# Patient Record
Sex: Female | Born: 1985
Health system: Southern US, Community
[De-identification: ages and names within clinical notes are randomized; demographics above are authoritative.]

## PROBLEM LIST (undated history)

## (undated) ENCOUNTER — Inpatient Hospital Stay (HOSPITAL_COMMUNITY): Payer: Self-pay

## (undated) DIAGNOSIS — R87619 Unspecified abnormal cytological findings in specimens from cervix uteri: Secondary | ICD-10-CM

## (undated) DIAGNOSIS — E669 Obesity, unspecified: Secondary | ICD-10-CM

## (undated) DIAGNOSIS — M329 Systemic lupus erythematosus, unspecified: Secondary | ICD-10-CM

## (undated) DIAGNOSIS — F53 Postpartum depression: Secondary | ICD-10-CM

## (undated) DIAGNOSIS — Z86718 Personal history of other venous thrombosis and embolism: Secondary | ICD-10-CM

## (undated) DIAGNOSIS — IMO0002 Reserved for concepts with insufficient information to code with codable children: Secondary | ICD-10-CM

## (undated) DIAGNOSIS — O99345 Other mental disorders complicating the puerperium: Secondary | ICD-10-CM

## (undated) HISTORY — DX: Personal history of other venous thrombosis and embolism: Z86.718

## (undated) HISTORY — DX: Other mental disorders complicating the puerperium: O99.345

## (undated) HISTORY — PX: WISDOM TOOTH EXTRACTION: SHX21

## (undated) HISTORY — DX: Obesity, unspecified: E66.9

## (undated) HISTORY — DX: Postpartum depression: F53.0

## (undated) HISTORY — DX: Reserved for concepts with insufficient information to code with codable children: IMO0002

## (undated) HISTORY — DX: Systemic lupus erythematosus, unspecified: M32.9

---

## 2010-08-25 ENCOUNTER — Inpatient Hospital Stay (HOSPITAL_COMMUNITY): Admission: AD | Admit: 2010-08-25 | Discharge: 2010-08-25 | Payer: Self-pay | Admitting: Obstetrics and Gynecology

## 2010-09-12 ENCOUNTER — Inpatient Hospital Stay (HOSPITAL_COMMUNITY)
Admission: AD | Admit: 2010-09-12 | Discharge: 2010-09-15 | Payer: Self-pay | Source: Home / Self Care | Attending: Obstetrics and Gynecology | Admitting: Obstetrics and Gynecology

## 2010-12-19 LAB — URINE MICROSCOPIC-ADD ON

## 2010-12-19 LAB — CBC
HCT: 30.5 % — ABNORMAL LOW (ref 36.0–46.0)
Hemoglobin: 8.6 g/dL — ABNORMAL LOW (ref 12.0–15.0)
Hemoglobin: 9.8 g/dL — ABNORMAL LOW (ref 12.0–15.0)
MCHC: 32.6 g/dL (ref 30.0–36.0)
MCV: 71 fL — ABNORMAL LOW (ref 78.0–100.0)
RDW: 16.6 % — ABNORMAL HIGH (ref 11.5–15.5)
RDW: 17 % — ABNORMAL HIGH (ref 11.5–15.5)
WBC: 8.9 10*3/uL (ref 4.0–10.5)
WBC: 9.5 10*3/uL (ref 4.0–10.5)

## 2010-12-19 LAB — URINALYSIS, ROUTINE W REFLEX MICROSCOPIC
Protein, ur: NEGATIVE mg/dL
Specific Gravity, Urine: <1.005 — ABNORMAL LOW (ref 1.005–1.030)
Urobilinogen, UA: 0.2 mg/dL (ref 0.0–1.0)

## 2012-10-08 HISTORY — PX: COLPOSCOPY: SHX161

## 2012-10-08 NOTE — L&D Delivery Note (Signed)
Delivery Note  SVD viable female Apgars 9,10 over intact perineum.  Placenta delivered spontaneously intact with 3VC. good support and hemostasis noted.  PH art was sent.  [place] cord blood was not done.  Mother and baby were doing well.  EBL 300cc  Candice Camp, MD

## 2013-01-11 ENCOUNTER — Encounter (HOSPITAL_COMMUNITY): Payer: Self-pay | Admitting: *Deleted

## 2013-01-11 ENCOUNTER — Inpatient Hospital Stay (HOSPITAL_COMMUNITY): Payer: BC Managed Care – PPO

## 2013-01-11 ENCOUNTER — Inpatient Hospital Stay (HOSPITAL_COMMUNITY)
Admission: AD | Admit: 2013-01-11 | Discharge: 2013-01-12 | Disposition: A | Discharge status: Home / Self Care | Payer: BC Managed Care – PPO | Source: Ambulatory Visit | Attending: Obstetrics & Gynecology | Admitting: Obstetrics & Gynecology

## 2013-01-11 DIAGNOSIS — Z3490 Encounter for supervision of normal pregnancy, unspecified, unspecified trimester: Secondary | ICD-10-CM

## 2013-01-11 DIAGNOSIS — O418X20 Other specified disorders of amniotic fluid and membranes, second trimester, not applicable or unspecified: Secondary | ICD-10-CM

## 2013-01-11 DIAGNOSIS — O26859 Spotting complicating pregnancy, unspecified trimester: Secondary | ICD-10-CM | POA: Insufficient documentation

## 2013-01-11 LAB — CBC
MCH: 28.6 pg (ref 26.0–34.0)
MCHC: 33.7 g/dL (ref 30.0–36.0)
Platelets: 214 10*3/uL (ref 150–400)
RDW: 14.3 % (ref 11.5–15.5)

## 2013-01-11 LAB — ABO/RH: ABO/RH(D): A POS

## 2013-01-11 NOTE — MAU Note (Signed)
Pt G2 P1 at 12wks, passed bright red blood with a clot in the toilet around 2030.  Light spotting and feeling a little pressure.

## 2013-01-11 NOTE — MAU Note (Signed)
Pt had been bleeding off and on since 20:30. Pt reports pressure, but denies pain.

## 2013-01-11 NOTE — MAU Provider Note (Signed)
Chief Complaint: Vaginal Bleeding   First Provider Initiated Contact with Patient 01/11/13 2248     SUBJECTIVE HPI: Jasmine Espinoza is a 27 y.o. G2P0101 at [redacted]w[redacted]d by LMP who presents to maternity admissions reporting bright red vaginal bleeding, enough to wear a pad, starting ~2 hours ago and continuing since arrival in MAU.  She denies cramping or pain at this time.  She denies vaginal itching/burning, urinary symptoms, h/a, dizziness, n/v, or fever/chills.     History reviewed. No pertinent past medical history. Past Surgical History  Procedure Laterality Date  . Wisdom tooth extraction     History   Social History  . Marital Status: Single    Spouse Name: N/A    Number of Children: N/A  . Years of Education: N/A   Occupational History  . Not on file.   Social History Main Topics  . Smoking status: Never Smoker   . Smokeless tobacco: Not on file  . Alcohol Use: No  . Drug Use: No  . Sexually Active: Not on file   Other Topics Concern  . Not on file   Social History Narrative  . No narrative on file   No current facility-administered medications on file prior to encounter.   No current outpatient prescriptions on file prior to encounter.   No Known Allergies  ROS: Pertinent items in HPI  OBJECTIVE Blood pressure 131/79, pulse 101, temperature 97.9 F (36.6 C), temperature source Oral, resp. rate 16, height 5\' 2"  (1.575 m), weight 87.091 kg (192 lb). GENERAL: Well-developed, well-nourished female in no acute distress.  HEENT: Normocephalic HEART: normal rate RESP: normal effort ABDOMEN: Soft, non-tender EXTREMITIES: Nontender, no edema NEURO: Alert and oriented Pelvic exam: Large amount bright red blood pooling in vagina, unable to clearly visualize cervix because increase in bleeding whenever speculum adjusted  LAB RESULTS Results for orders placed during the hospital encounter of 01/11/13 (from the past 24 hour(s))  CBC     Status: Abnormal   Collection Time    01/11/13 10:37 PM      Result Value Range   WBC 12.8 (*) 4.0 - 10.5 K/uL   RBC 4.62  3.87 - 5.11 MIL/uL   Hemoglobin 13.2  12.0 - 15.0 g/dL   HCT 57.8  46.9 - 62.9 %   MCV 84.8  78.0 - 100.0 fL   MCH 28.6  26.0 - 34.0 pg   MCHC 33.7  30.0 - 36.0 g/dL   RDW 52.8  41.3 - 24.4 %   Platelets 214  150 - 400 K/uL  ABO/RH     Status: None   Collection Time    01/11/13 10:37 PM      Result Value Range   ABO/RH(D) A POS      IMAGING US Ob Comp Less 14 Wks  01/11/2013  *RADIOLOGY REPORT*  Clinical Data: Vaginal bleeding; intrauterine device removed 11/14/2012.  OBSTETRIC <14 WK ULTRASOUND  Technique:  Transabdominal ultrasound was performed for evaluation of the gestation as well as the maternal uterus and adnexal regions.  Comparison:  None.  Intrauterine gestational sac: Visualized/normal in shape. Yolk sac: Yes Embryo: Yes Cardiac Activity: Yes Heart Rate: 174 bpm  CRL:  52.3 mm  12 w  0 d            [place] EDC: 07/26/2013  Maternal uterus/Adnexae: A large amount of subchorionic hemorrhage is noted.  The uterus is otherwise unremarkable in appearance.  The ovaries are within normal limits.  The right ovary measures 2.9  x 1.6 x 1.5 cm, while the left ovary measures 3.3 x 2.6 x 1.6 cm. No suspicious adnexal masses are seen; there is no evidence for ovarian torsion.  No free fluid is seen within the pelvic cul-de-sac.  IMPRESSION:  1.  Single live intrauterine pregnancy noted, with a crown-rump length of 5.2 cm, corresponding to a gestational age of [redacted] weeks 0 days.  This matches the gestational age by prior ultrasound, reflecting an estimated date of delivery of July 26, 2013. 2.  Large amount of subchorionic hemorrhage noted.   Original Report Authenticated By: Tonia Ghent, M.D.     ASSESSMENT 1. Subchorionic hemorrhage, second trimester   2. Viable pregnancy     PLAN Called Dr Langston Masker to discuss assessment and findings Discharge home with bleeding precautions Discussed  unknown prognosis with [facility], pt states understanding F/U in office this week as scheduled Return to MAU as needed    Medication List    ASK your doctor about these medications       prenatal multivitamin Tabs  Take 1 tablet by mouth every morning.         Sharen Counter Certified Nurse-Midwife 01/11/2013  10:49 PM

## 2013-01-12 DIAGNOSIS — O468X9 Other antepartum hemorrhage, unspecified trimester: Secondary | ICD-10-CM

## 2013-06-18 ENCOUNTER — Encounter (HOSPITAL_COMMUNITY): Payer: Self-pay | Admitting: *Deleted

## 2013-06-18 ENCOUNTER — Inpatient Hospital Stay (HOSPITAL_COMMUNITY)
Admission: AD | Admit: 2013-06-18 | Discharge: 2013-06-19 | Disposition: A | Discharge status: Home / Self Care | Payer: 59 | Source: Ambulatory Visit | Attending: Obstetrics and Gynecology | Admitting: Obstetrics and Gynecology

## 2013-06-18 DIAGNOSIS — O4700 False labor before 37 completed weeks of gestation, unspecified trimester: Secondary | ICD-10-CM | POA: Insufficient documentation

## 2013-06-18 DIAGNOSIS — O479 False labor, unspecified: Secondary | ICD-10-CM

## 2013-06-18 MED ORDER — NIFEDIPINE 10 MG PO CAPS
10.0000 mg | ORAL_CAPSULE | Freq: Once | ORAL | Status: AC
  Administered 2013-06-19: 10 mg via ORAL
  Filled 2013-06-18: qty 1

## 2013-06-18 NOTE — MAU Note (Signed)
PT SAYS  SHE HAS HAD SPOTTING WHEN SHE WIPES  OFF/ON  USUALLY   IN AM.       SAYS STARTED HURTING  -  ALL DAY  HAVE GONE AWAY BUT  AT 10PM-  BECAME CLOSER  TOGETHER.  VE IN OFFICE ON  YESTERDAY -- 2 CM.     DENIES HSV AND MRSA.

## 2013-06-18 NOTE — MAU Provider Note (Signed)
  History     CSN: [id number]  Arrival date and time: 06/18/13 2237   First Provider Initiated Contact with Patient 06/18/13 2315      No chief complaint on file.  HPI  Jasmine Espinoza is a 27 y.o. G2P0101 at [redacted]w[redacted]d who presents today with UCs. She states that she has had them off and on all day, but around 2145 they became more regular. She denies any LOF or VB and confirms fetal movement. She was checked yesterday in the office and was 2/50. She has a history of 36 week preterm delivery.   History reviewed. No pertinent past medical history.  Past Surgical History  Procedure Laterality Date  . Wisdom tooth extraction      History reviewed. No pertinent family history.  History  Substance Use Topics  . Smoking status: Never Smoker   . Smokeless tobacco: Not on file  . Alcohol Use: No    Allergies: No Known Allergies  Prescriptions prior to admission  Medication Sig Dispense Refill  . Prenatal Vit-Fe Fumarate-FA (PRENATAL MULTIVITAMIN) TABS Take 1 tablet by mouth every morning.        ROS Physical Exam   Blood pressure 125/78, pulse 84, temperature 98 F (36.7 C), temperature source Oral, resp. rate 20, height 5\' 2"  (1.575 m), weight 97.523 kg (215 lb).  Physical Exam  Nursing note and vitals reviewed. Constitutional: She is oriented to person, place, and time. She appears well-developed and well-nourished. No distress.  Cardiovascular: Normal rate.   Respiratory: Effort normal.  GI: Soft. There is no tenderness.  Genitourinary:  Cervix: 2-3/60/-1  Neurological: She is alert and oriented to person, place, and time.  Skin: Skin is warm and dry.  Psychiatric: She has a normal mood and affect.    MAU Course  Procedures  2348: C/W Dr. Vincente Poli, will give 10 mg Procardia. If contractions space out she may be DC home.  0150: Ctx have spaced out, no cervical change (2-3/60/-1)  Assessment and Plan   1. Labor, false (Braxton-Hicks), antepartum    PTL  precautions reviewed FU with Dr. Vincente Poli as scheduled Return to MAU as needed   Tawnya Crook 06/18/2013, 11:27 PM

## 2013-06-19 DIAGNOSIS — O479 False labor, unspecified: Secondary | ICD-10-CM

## 2013-06-28 ENCOUNTER — Encounter (HOSPITAL_COMMUNITY): Payer: Self-pay

## 2013-06-28 ENCOUNTER — Inpatient Hospital Stay (HOSPITAL_COMMUNITY)
Admission: AD | Admit: 2013-06-28 | Discharge: 2013-06-28 | Disposition: A | Discharge status: Home / Self Care | Payer: 59 | Source: Ambulatory Visit | Attending: Obstetrics & Gynecology | Admitting: Obstetrics & Gynecology

## 2013-06-28 DIAGNOSIS — R32 Unspecified urinary incontinence: Secondary | ICD-10-CM

## 2013-06-28 DIAGNOSIS — R030 Elevated blood-pressure reading, without diagnosis of hypertension: Secondary | ICD-10-CM | POA: Insufficient documentation

## 2013-06-28 DIAGNOSIS — R0989 Other specified symptoms and signs involving the circulatory and respiratory systems: Secondary | ICD-10-CM

## 2013-06-28 DIAGNOSIS — O99891 Other specified diseases and conditions complicating pregnancy: Secondary | ICD-10-CM | POA: Insufficient documentation

## 2013-06-28 DIAGNOSIS — O4700 False labor before 37 completed weeks of gestation, unspecified trimester: Secondary | ICD-10-CM | POA: Insufficient documentation

## 2013-06-28 LAB — COMPREHENSIVE METABOLIC PANEL
Albumin: 2.8 g/dL — ABNORMAL LOW (ref 3.5–5.2)
Alkaline Phosphatase: 109 U/L (ref 39–117)
BUN: 6 mg/dL (ref 6–23)
Potassium: 3.4 mEq/L — ABNORMAL LOW (ref 3.5–5.1)
Sodium: 134 mEq/L — ABNORMAL LOW (ref 135–145)
Total Protein: 6.7 g/dL (ref 6.0–8.3)

## 2013-06-28 LAB — CBC
HCT: 34.1 % — ABNORMAL LOW (ref 36.0–46.0)
MCHC: 34 g/dL (ref 30.0–36.0)
Platelets: 199 10*3/uL (ref 150–400)
RDW: 13.5 % (ref 11.5–15.5)

## 2013-06-28 LAB — URINALYSIS, ROUTINE W REFLEX MICROSCOPIC
Glucose, UA: NEGATIVE mg/dL
Ketones, ur: NEGATIVE mg/dL
Leukocytes, UA: NEGATIVE
pH: 6.5 (ref 5.0–8.0)

## 2013-06-28 NOTE — MAU Note (Signed)
Leaking trickle of fld since 2330. Some irreg contractions. Was seen 2 wks ago with contractions but they stopped them and have been fine since.

## 2013-06-28 NOTE — MAU Provider Note (Signed)
History     CSN: [id number]  Arrival date and time: 06/28/13 0125   First Provider Initiated Contact with Patient 06/28/13 0207      Chief Complaint  Patient presents with  . Rupture of Membranes  . Contractions   HPI This is a 27 y.o. female at [redacted]w[redacted]d who presents with c/o trickle of fluid when she went to urinate. States only happened once. Irregular contractions. Denies headache, swelling or visual changes.   RN Note: Leaking trickle of fld since 2330. Some irreg contractions. Was seen 2 wks ago with contractions but they stopped them and have been fine since.      OB History   Grav Para Term Preterm Abortions TAB SAB Ect Mult Living   2 1  1      1       History reviewed. No pertinent past medical history.  Past Surgical History  Procedure Laterality Date  . Wisdom tooth extraction      History reviewed. No pertinent family history.  History  Substance Use Topics  . Smoking status: Never Smoker   . Smokeless tobacco: Not on file  . Alcohol Use: No    Allergies: No Known Allergies  Prescriptions prior to admission  Medication Sig Dispense Refill  . Prenatal Vit-Fe Fumarate-FA (PRENATAL MULTIVITAMIN) TABS Take 1 tablet by mouth every morning.        Review of Systems  Constitutional: Negative for fever, chills and malaise/fatigue.  Eyes: Negative for blurred vision.  Gastrointestinal: Negative for nausea, vomiting, abdominal pain, diarrhea and constipation.  Genitourinary: Negative for dysuria.  Musculoskeletal: Negative for myalgias.  Neurological: Negative for dizziness and headaches.   Physical Exam   Blood pressure 115/66, pulse 115, temperature 97.9 F (36.6 C), temperature source Oral, resp. rate 18, height 5\' 3"  (1.6 m), weight 96.979 kg (213 lb 12.8 oz), SpO2 100.00%. Filed Vitals:   06/28/13 0137 06/28/13 0151 06/28/13 0204  BP: 130/88 143/95 115/66  Pulse: 101 116 115  Temp: 97.9 F (36.6 C) 97.9 F (36.6 C)   TempSrc:  Oral   Resp:  20 18   Height: 5\' 3"  (1.6 m)    Weight: 96.979 kg (213 lb 12.8 oz)    SpO2:  100%     Physical Exam  Constitutional: She is oriented to person, place, and time. She appears well-developed and well-nourished. No distress.  HENT:  Head: Normocephalic.  Cardiovascular: Normal rate.   Respiratory: Effort normal.  GI: Soft. She exhibits no distension. There is no tenderness. There is no rebound and no guarding.  Genitourinary: Uterus normal. Vaginal discharge (thick white, no pooling, no amniotic fluid visible) found.  Musculoskeletal: Normal range of motion. She exhibits no edema and no tenderness.  Neurological: She is alert and oriented to person, place, and time. She has normal reflexes. She displays normal reflexes. She exhibits normal muscle tone.  Skin: Skin is warm and dry.  Psychiatric: She has a normal mood and affect.  Fetal heart rate reactive Irregular mild contractions   MAU Course  Procedures  MDM Discussed with Dr Langston Masker. Will check PIH labs and d/c home if normal Results for orders placed during the hospital encounter of 06/28/13 (from the past 72 hour(s))  URINALYSIS, ROUTINE W REFLEX MICROSCOPIC     Status: Abnormal   Collection Time    06/28/13  1:45 AM      Result Value Range   Color, Urine YELLOW  YELLOW   APPearance CLEAR  CLEAR   Specific Gravity, Urine <  1.005 (*) 1.005 - 1.030   pH 6.5  5.0 - 8.0   Glucose, UA NEGATIVE  NEGATIVE mg/dL   Hgb urine dipstick NEGATIVE  NEGATIVE   Bilirubin Urine NEGATIVE  NEGATIVE   Ketones, ur NEGATIVE  NEGATIVE mg/dL   Protein, ur NEGATIVE  NEGATIVE mg/dL   Urobilinogen, UA 0.2  0.0 - 1.0 mg/dL   Nitrite NEGATIVE  NEGATIVE   Leukocytes, UA NEGATIVE  NEGATIVE   Comment: MICROSCOPIC NOT DONE ON URINES WITH NEGATIVE PROTEIN, BLOOD, LEUKOCYTES, NITRITE, OR GLUCOSE <1000 mg/dL.  CBC     Status: Abnormal   Collection Time    06/28/13  2:35 AM      Result Value Range   WBC 9.8  4.0 - 10.5 K/uL   RBC 4.21  3.87 - 5.11  MIL/uL   Hemoglobin 11.6 (*) 12.0 - 15.0 g/dL   HCT 82.9 (*) 56.2 - 13.0 %   MCV 81.0  78.0 - 100.0 fL   MCH 27.6  26.0 - 34.0 pg   MCHC 34.0  30.0 - 36.0 g/dL   RDW 86.5  78.4 - 69.6 %   Platelets 199  150 - 400 K/uL  COMPREHENSIVE METABOLIC PANEL     Status: Abnormal   Collection Time    06/28/13  2:35 AM      Result Value Range   Sodium 134 (*) 135 - 145 mEq/L   Potassium 3.4 (*) 3.5 - 5.1 mEq/L   Chloride 100  96 - 112 mEq/L   CO2 20  19 - 32 mEq/L   Glucose, Bld 88  70 - 99 mg/dL   BUN 6  6 - 23 mg/dL   Creatinine, Ser 2.95 (*) 0.50 - 1.10 mg/dL   Calcium 9.3  8.4 - 28.4 mg/dL   Total Protein 6.7  6.0 - 8.3 g/dL   Albumin 2.8 (*) 3.5 - 5.2 g/dL   AST 11  0 - 37 U/L   ALT 7  0 - 35 U/L   Alkaline Phosphatase 109  39 - 117 U/L   Total Bilirubin 0.3  0.3 - 1.2 mg/dL   GFR calc non Af Amer >90  >90 mL/min   GFR calc Af Amer >90  >90 mL/min   Comment: (NOTE)     The eGFR has been calculated using the CKD EPI equation.     This calculation has not been validated in all clinical situations.     eGFR's persistently <90 mL/min signify possible Chronic Kidney     Disease.   Filed Vitals:   06/28/13 0137 06/28/13 0151 06/28/13 0204 06/28/13 0245  BP: 130/88 143/95 115/66 131/80  Pulse: 101 116 115 93  Temp: 97.9 F (36.6 C) 97.9 F (36.6 C)    TempSrc:  Oral    Resp: 20 18    Height: 5\' 3"  (1.6 m)     Weight: 96.979 kg (213 lb 12.8 oz)     SpO2:  100%       Assessment and Plan  A:  SIUP at [redacted]w[redacted]d       No evidence for ROM      Rare elevated BPs      No evidence of Preeclampsia  P: Discharge home      PIH and labor precautions      Followup in office  [facility] 06/28/2013, 2:24 AM

## 2013-06-30 ENCOUNTER — Encounter (HOSPITAL_COMMUNITY): Payer: Self-pay

## 2013-06-30 ENCOUNTER — Inpatient Hospital Stay (HOSPITAL_COMMUNITY)
Admission: AD | Admit: 2013-06-30 | Discharge: 2013-07-01 | Disposition: A | Discharge status: Home / Self Care | Payer: 59 | Source: Ambulatory Visit | Attending: Obstetrics and Gynecology | Admitting: Obstetrics and Gynecology

## 2013-06-30 DIAGNOSIS — O4700 False labor before 37 completed weeks of gestation, unspecified trimester: Secondary | ICD-10-CM | POA: Insufficient documentation

## 2013-06-30 NOTE — MAU Note (Signed)
Per Dr Rana Snare, recheck pt in one hour.

## 2013-06-30 NOTE — MAU Note (Signed)
Pt G2 P1 at 36.2wks having contractions every 4-55min since 1900.  Bloody show since last night.

## 2013-07-08 ENCOUNTER — Inpatient Hospital Stay (HOSPITAL_COMMUNITY): Payer: 59 | Admitting: Anesthesiology

## 2013-07-08 ENCOUNTER — Inpatient Hospital Stay (HOSPITAL_COMMUNITY)
Admission: AD | Admit: 2013-07-08 | Discharge: 2013-07-10 | DRG: 775 | Disposition: A | Discharge status: Home / Self Care | Payer: 59 | Source: Ambulatory Visit | Attending: Obstetrics and Gynecology | Admitting: Obstetrics and Gynecology

## 2013-07-08 ENCOUNTER — Encounter (HOSPITAL_COMMUNITY): Payer: Self-pay | Admitting: *Deleted

## 2013-07-08 ENCOUNTER — Encounter (HOSPITAL_COMMUNITY): Payer: Self-pay | Admitting: Anesthesiology

## 2013-07-08 HISTORY — DX: Reserved for concepts with insufficient information to code with codable children: IMO0002

## 2013-07-08 HISTORY — DX: Unspecified abnormal cytological findings in specimens from cervix uteri: R87.619

## 2013-07-08 LAB — OB RESULTS CONSOLE ANTIBODY SCREEN: Antibody Screen: NEGATIVE

## 2013-07-08 LAB — CBC
HCT: 37.1 % (ref 36.0–46.0)
MCV: 80.3 fL (ref 78.0–100.0)
Platelets: 198 10*3/uL (ref 150–400)
RBC: 4.62 MIL/uL (ref 3.87–5.11)
WBC: 14.9 10*3/uL — ABNORMAL HIGH (ref 4.0–10.5)

## 2013-07-08 LAB — OB RESULTS CONSOLE RUBELLA ANTIBODY, IGM: Rubella: IMMUNE

## 2013-07-08 LAB — TYPE AND SCREEN: ABO/RH(D): A POS

## 2013-07-08 LAB — OB RESULTS CONSOLE HIV ANTIBODY (ROUTINE TESTING): HIV: NONREACTIVE

## 2013-07-08 LAB — OB RESULTS CONSOLE RPR: RPR: NONREACTIVE

## 2013-07-08 MED ORDER — EPHEDRINE 5 MG/ML INJ: 10.0000 mg | INTRAVENOUS | Status: DC | PRN

## 2013-07-08 MED ORDER — EPHEDRINE 5 MG/ML INJ
INTRAVENOUS | Status: AC
  Filled 2013-07-08: qty 4

## 2013-07-08 MED ORDER — DIPHENHYDRAMINE HCL 50 MG/ML IJ SOLN: 12.5000 mg | INTRAMUSCULAR | Status: DC | PRN

## 2013-07-08 MED ORDER — MEDROXYPROGESTERONE ACETATE 150 MG/ML IM SUSP: 150.0000 mg | INTRAMUSCULAR | Status: DC | PRN

## 2013-07-08 MED ORDER — FLEET ENEMA 7-19 GM/118ML RE ENEM: 1.0000 | ENEMA | RECTAL | Status: DC | PRN

## 2013-07-08 MED ORDER — WITCH HAZEL-GLYCERIN EX PADS: 1.0000 "application " | MEDICATED_PAD | CUTANEOUS | Status: DC | PRN

## 2013-07-08 MED ORDER — CITRIC ACID-SODIUM CITRATE 334-500 MG/5ML PO SOLN: 30.0000 mL | ORAL | Status: DC | PRN

## 2013-07-08 MED ORDER — OXYTOCIN BOLUS FROM INFUSION: 500.0000 mL | INTRAVENOUS | Status: DC

## 2013-07-08 MED ORDER — TETANUS-DIPHTH-ACELL PERTUSSIS 5-2.5-18.5 LF-MCG/0.5 IM SUSP
0.5000 mL | Freq: Once | INTRAMUSCULAR | Status: DC
  Filled 2013-07-08: qty 0.5

## 2013-07-08 MED ORDER — DIBUCAINE 1 % RE OINT
1.0000 "application " | TOPICAL_OINTMENT | RECTAL | Status: DC | PRN
  Filled 2013-07-08: qty 28

## 2013-07-08 MED ORDER — MEASLES, MUMPS & RUBELLA VAC ~~LOC~~ INJ
0.5000 mL | INJECTION | Freq: Once | SUBCUTANEOUS | Status: DC
  Filled 2013-07-08: qty 0.5

## 2013-07-08 MED ORDER — DIPHENHYDRAMINE HCL 25 MG PO CAPS: 25.0000 mg | ORAL_CAPSULE | Freq: Four times a day (QID) | ORAL | Status: DC | PRN

## 2013-07-08 MED ORDER — LACTATED RINGERS IV SOLN: 500.0000 mL | Freq: Once | INTRAVENOUS | Status: DC

## 2013-07-08 MED ORDER — SENNOSIDES-DOCUSATE SODIUM 8.6-50 MG PO TABS
2.0000 | ORAL_TABLET | Freq: Every day | ORAL | Status: DC
  Administered 2013-07-09: 2 via ORAL

## 2013-07-08 MED ORDER — PRENATAL MULTIVITAMIN CH: 1.0000 | ORAL_TABLET | Freq: Every day | ORAL | Status: DC

## 2013-07-08 MED ORDER — LANOLIN HYDROUS EX OINT: TOPICAL_OINTMENT | CUTANEOUS | Status: DC | PRN

## 2013-07-08 MED ORDER — ONDANSETRON HCL 4 MG/2ML IJ SOLN: 4.0000 mg | INTRAMUSCULAR | Status: DC | PRN

## 2013-07-08 MED ORDER — OXYTOCIN 40 UNITS IN LACTATED RINGERS INFUSION - SIMPLE MED
62.5000 mL/h | INTRAVENOUS | Status: DC
  Administered 2013-07-08: 62.5 mL/h via INTRAVENOUS
  Filled 2013-07-08: qty 1000

## 2013-07-08 MED ORDER — IBUPROFEN 600 MG PO TABS: 600.0000 mg | ORAL_TABLET | Freq: Four times a day (QID) | ORAL | Status: DC | PRN

## 2013-07-08 MED ORDER — IBUPROFEN 600 MG PO TABS
600.0000 mg | ORAL_TABLET | Freq: Four times a day (QID) | ORAL | Status: DC
  Administered 2013-07-09 – 2013-07-10 (×5): 600 mg via ORAL
  Filled 2013-07-08 (×5): qty 1

## 2013-07-08 MED ORDER — FENTANYL 2.5 MCG/ML BUPIVACAINE 1/10 % EPIDURAL INFUSION (WH - ANES)
INTRAMUSCULAR | Status: DC | PRN
  Administered 2013-07-08: 14 mL/h via EPIDURAL

## 2013-07-08 MED ORDER — LACTATED RINGERS IV SOLN: 500.0000 mL | INTRAVENOUS | Status: DC | PRN

## 2013-07-08 MED ORDER — ACETAMINOPHEN 325 MG PO TABS: 650.0000 mg | ORAL_TABLET | ORAL | Status: DC | PRN

## 2013-07-08 MED ORDER — ONDANSETRON HCL 4 MG/2ML IJ SOLN: 4.0000 mg | Freq: Four times a day (QID) | INTRAMUSCULAR | Status: DC | PRN

## 2013-07-08 MED ORDER — PRENATAL MULTIVITAMIN CH
1.0000 | ORAL_TABLET | Freq: Every morning | ORAL | Status: DC
  Administered 2013-07-09: 1 via ORAL
  Filled 2013-07-08: qty 1

## 2013-07-08 MED ORDER — OXYCODONE-ACETAMINOPHEN 5-325 MG PO TABS: 1.0000 | ORAL_TABLET | ORAL | Status: DC | PRN

## 2013-07-08 MED ORDER — ZOLPIDEM TARTRATE 5 MG PO TABS: 5.0000 mg | ORAL_TABLET | Freq: Every evening | ORAL | Status: DC | PRN

## 2013-07-08 MED ORDER — LACTATED RINGERS IV SOLN
INTRAVENOUS | Status: DC
  Administered 2013-07-08 (×2): via INTRAVENOUS

## 2013-07-08 MED ORDER — ONDANSETRON HCL 4 MG PO TABS: 4.0000 mg | ORAL_TABLET | ORAL | Status: DC | PRN

## 2013-07-08 MED ORDER — FENTANYL 2.5 MCG/ML BUPIVACAINE 1/10 % EPIDURAL INFUSION (WH - ANES)
INTRAMUSCULAR | Status: AC
  Filled 2013-07-08: qty 125

## 2013-07-08 MED ORDER — FENTANYL 2.5 MCG/ML BUPIVACAINE 1/10 % EPIDURAL INFUSION (WH - ANES): 14.0000 mL/h | INTRAMUSCULAR | Status: DC | PRN

## 2013-07-08 MED ORDER — PHENYLEPHRINE 40 MCG/ML (10ML) SYRINGE FOR IV PUSH (FOR BLOOD PRESSURE SUPPORT): 80.0000 ug | PREFILLED_SYRINGE | INTRAVENOUS | Status: DC | PRN

## 2013-07-08 MED ORDER — PHENYLEPHRINE 40 MCG/ML (10ML) SYRINGE FOR IV PUSH (FOR BLOOD PRESSURE SUPPORT)
PREFILLED_SYRINGE | INTRAVENOUS | Status: AC
  Filled 2013-07-08: qty 5

## 2013-07-08 MED ORDER — BENZOCAINE-MENTHOL 20-0.5 % EX AERO
1.0000 "application " | INHALATION_SPRAY | CUTANEOUS | Status: DC | PRN
  Filled 2013-07-08: qty 56

## 2013-07-08 MED ORDER — SIMETHICONE 80 MG PO CHEW: 80.0000 mg | CHEWABLE_TABLET | ORAL | Status: DC | PRN

## 2013-07-08 MED ORDER — LIDOCAINE HCL (PF) 1 % IJ SOLN
INTRAMUSCULAR | Status: DC | PRN
  Administered 2013-07-08 (×2): 3 mL

## 2013-07-08 MED ORDER — LIDOCAINE HCL (PF) 1 % IJ SOLN
30.0000 mL | INTRAMUSCULAR | Status: DC | PRN
  Filled 2013-07-08: qty 30

## 2013-07-08 NOTE — H&P (Signed)
Jasmine Espinoza is a 27 y.o. female presenting for labor sxs.  Ctxs now 2-3 minutes and Cx 5 cm Pregnancy uncomplicated. History OB History   Grav Para Term Preterm Abortions TAB SAB Ect Mult Living   2 1  1      1      History reviewed. No pertinent past medical history. Past Surgical History  Procedure Laterality Date  . Wisdom tooth extraction     Family History: family history is not on file. Social History:  reports that she has never smoked. She does not have any smokeless tobacco history on file. She reports that she does not drink alcohol or use illicit drugs.   Prenatal Transfer Tool  Maternal Diabetes: No Genetic Screening: Normal Maternal Ultrasounds/Referrals: Normal Fetal Ultrasounds or other Referrals:  None Maternal Substance Abuse:  No Significant Maternal Medications:  None Significant Maternal Lab Results:  None Other Comments:  None  ROS  Dilation: 5 (bulging membranes) Effacement (%): 90 Station: -2 Exam by:: Thressa Sheller, RN Blood pressure 134/87, pulse 103, temperature 97.7 F (36.5 C), temperature source Oral, resp. rate 18, height 5\' 3"  (1.6 m), weight 96.163 kg (212 lb), SpO2 98.00%. Exam Physical Exam  Prenatal labs: ABO, Rh: --/--/A POS (04/06 2237) Antibody:   Rubella:   RPR:    HBsAg:    HIV:    GBS:     Assessment/Plan: IUP at term in active labor Plan amniotomy and expectant manangement Anticipate SVD   Carlean Crowl C 07/08/2013, 6:27 PM

## 2013-07-08 NOTE — MAU Note (Signed)
Pt had VE in office today and was 3.5/90 percent.   Started having U/C's at 1600 every 2 minutes now.  Denies ROM but small amount of bloody show.  Good fetal movement.

## 2013-07-08 NOTE — Anesthesia Procedure Notes (Signed)
Epidural Patient location during procedure: OB Start time: 07/08/2013 7:10 PM End time: 07/08/2013 7:20 PM  Staffing Anesthesiologist: Lewie Loron R Performed by: anesthesiologist   Preanesthetic Checklist Completed: patient identified, pre-op evaluation, timeout performed, IV checked, risks and benefits discussed and monitors and equipment checked  Epidural Patient position: sitting Prep: site prepped and draped and DuraPrep Patient monitoring: heart rate, continuous pulse ox and blood pressure Approach: midline Injection technique: LOR air and LOR saline  Needle:  Needle type: Tuohy  Needle gauge: 17 G Needle length: 9 cm Needle insertion depth: 6 cm Catheter type: closed end flexible Catheter size: 19 Gauge Catheter at skin depth: 12 cm Test dose: negative  Assessment Sensory level: T7 Events: blood not aspirated, injection not painful, no injection resistance, negative IV test and no paresthesia  Additional Notes Reason for block:procedure for pain

## 2013-07-08 NOTE — Anesthesia Preprocedure Evaluation (Signed)
Anesthesia Evaluation  Patient identified by MRN, date of birth, ID band Patient awake    Reviewed: Allergy & Precautions, H&P , NPO status , Patient's Chart, lab work & pertinent test results  Airway Mallampati: II TM Distance: >3 FB Neck ROM: full    Dental no notable dental hx.    Pulmonary neg pulmonary ROS,    Pulmonary exam normal       Cardiovascular negative cardio ROS      Neuro/Psych negative neurological ROS  negative psych ROS   GI/Hepatic negative GI ROS, Neg liver ROS,   Endo/Other  Morbid obesity  Renal/GU negative Renal ROS     Musculoskeletal   Abdominal Normal abdominal exam  (+)   Peds  Hematology negative hematology ROS (+)   Anesthesia Other Findings   Reproductive/Obstetrics (+) Pregnancy                           Anesthesia Physical Anesthesia Plan  ASA: III  Anesthesia Plan: Epidural   Post-op Pain Management:    Induction:   Airway Management Planned:   Additional Equipment:   Intra-op Plan:   Post-operative Plan:   Informed Consent: I have reviewed the patients History and Physical, chart, labs and discussed the procedure including the risks, benefits and alternatives for the proposed anesthesia with the patient or authorized representative who has indicated his/her understanding and acceptance.     Plan Discussed with:   Anesthesia Plan Comments:         Anesthesia Quick Evaluation  

## 2013-07-09 ENCOUNTER — Encounter (HOSPITAL_COMMUNITY): Payer: Self-pay | Admitting: *Deleted

## 2013-07-09 LAB — CBC
HCT: 31.7 % — ABNORMAL LOW (ref 36.0–46.0)
MCV: 80.7 fL (ref 78.0–100.0)
Platelets: 196 10*3/uL (ref 150–400)
RBC: 3.93 MIL/uL (ref 3.87–5.11)
RDW: 13.6 % (ref 11.5–15.5)
WBC: 16.4 10*3/uL — ABNORMAL HIGH (ref 4.0–10.5)

## 2013-07-09 NOTE — Lactation Note (Addendum)
This note was copied from the chart of Jasmine Espinoza. Lactation Consultation Note  Patient Name: Jasmine Espinoza Today's Date: 07/09/2013 Reason for consult: Initial assessment Mom reports this baby is latching well. Baby asleep at this visit. Her 1st baby did not latch after being in NICU for 4 days after delivery and having lots of bottles.  She reports being pleased this baby is latching. BF basics reviewed with Mom. Encouraged to BF with feeding ques, at least every 3 hours. Keep baby STS when awake. Discussed cluster feeding. Lactation brochure left for review, advised of OP services and support group. Advised Mom to call for latch check with some feedings.   Maternal Data Formula Feeding for Exclusion: No Infant to breast within first hour of birth: Yes Has patient been taught Hand Expression?: Yes Does the patient have breastfeeding experience prior to this delivery?: Yes  Feeding    LATCH Score/Interventions       Type of Nipple: Everted at rest and after stimulation  Comfort (Breast/Nipple): Soft / non-tender           Lactation Tools Discussed/Used     Consult Status Consult Status: Follow-up Date: 07/10/13 Follow-up type: In-patient    Jasmine Espinoza 07/09/2013, 2:20 PM

## 2013-07-09 NOTE — Anesthesia Postprocedure Evaluation (Signed)
  Anesthesia Post-op Note  Anesthesia Post Note  Patient: Jasmine Espinoza  Procedure(s) Performed: * No procedures listed *  Anesthesia type: Epidural  Patient location: Mother/Baby  Post pain: Pain level controlled  Post assessment: Post-op Vital signs reviewed  Last Vitals:  Filed Vitals:   07/09/13 1758  BP: 117/79  Pulse: 92  Temp: 37.1 C  Resp: 20    Post vital signs: Reviewed  Level of consciousness:alert  Complications: No apparent anesthesia complications

## 2013-07-09 NOTE — Progress Notes (Signed)
Post Partum Day 1 Subjective: no complaints, up ad lib, voiding and tolerating PO  Objective: Blood pressure 126/73, pulse 78, temperature 98 F (36.7 C), temperature source Oral, resp. rate 18, height 5\' 3"  (1.6 m), weight 212 lb (96.163 kg), SpO2 99.00%, unknown if currently breastfeeding.  Physical Exam:  General: alert, cooperative and appears stated age Lochia: appropriate Uterine Fundus: firm Incision: n/a DVT Evaluation: No evidence of DVT seen on physical exam. Negative Homan's sign. No cords or calf tenderness.   Recent Labs  07/08/13 1835 07/09/13 0605  HGB 12.3 10.6*  HCT 37.1 31.7*    Assessment/Plan: Plan for discharge tomorrow and Breastfeeding   LOS: 1 day   Rachana Malesky 07/09/2013, 9:04 AM

## 2013-07-10 MED ORDER — IBUPROFEN 600 MG PO TABS: 600.0000 mg | ORAL_TABLET | Freq: Four times a day (QID) | ORAL | Status: DC | PRN

## 2013-07-10 MED ORDER — OXYCODONE-ACETAMINOPHEN 5-325 MG PO TABS: 1.0000 | ORAL_TABLET | Freq: Four times a day (QID) | ORAL | Status: DC | PRN

## 2013-07-10 NOTE — Progress Notes (Signed)
Post Partum Day 2 Subjective: no complaints, up ad lib, voiding, tolerating PO and + flatus  Objective: Blood pressure 107/69, pulse 74, temperature 97.9 F (36.6 C), temperature source Oral, resp. rate 18, height 5\' 3"  (1.6 m), weight 212 lb (96.163 kg), SpO2 99.00%, unknown if currently breastfeeding.  Physical Exam:  General: alert, cooperative and no distress Lochia: appropriate Uterine Fundus: firm Incision: healing well DVT Evaluation: No evidence of DVT seen on physical exam.   Recent Labs  07/08/13 1835 07/09/13 0605  HGB 12.3 10.6*  HCT 37.1 31.7*    Assessment/Plan: Discharge home   LOS: 2 days   Jasmine Espinoza,Jasmine Espinoza 07/10/2013, 8:48 AM

## 2013-07-10 NOTE — Discharge Summary (Signed)
Obstetric Discharge Summary Reason for Admission: onset of labor Prenatal Procedures: ultrasound Intrapartum Procedures: spontaneous vaginal delivery Postpartum Procedures: none Complications-Operative and Postpartum: none Hemoglobin  Date Value Range Status  07/09/2013 10.6* 12.0 - 15.0 g/dL Final     HCT  Date Value Range Status  07/09/2013 31.7* 36.0 - 46.0 % Final    Physical Exam:  General: alert, cooperative and no distress Lochia: appropriate Uterine Fundus: firm Incision: healing well DVT Evaluation: No evidence of DVT seen on physical exam.  Discharge Diagnoses: Term Pregnancy-delivered  Discharge Information: Date: 07/10/2013 Activity: pelvic rest Diet: routine Medications: PNV, Ibuprofen and Percocet Condition: stable Instructions: refer to practice specific booklet Discharge to: home   Newborn Data: Live born female  Birth Weight: 6 lb 2 oz (2778 g) APGAR: 9, 10  Home with mother.  Jasmine Espinoza,Jasmine Espinoza 07/10/2013, 8:49 AM

## 2013-10-08 DIAGNOSIS — Z86718 Personal history of other venous thrombosis and embolism: Secondary | ICD-10-CM

## 2013-10-08 HISTORY — DX: Personal history of other venous thrombosis and embolism: Z86.718

## 2014-04-08 ENCOUNTER — Ambulatory Visit (INDEPENDENT_AMBULATORY_CARE_PROVIDER_SITE_OTHER): Payer: 59 | Admitting: Emergency Medicine

## 2014-04-08 ENCOUNTER — Encounter: Payer: Self-pay | Admitting: Emergency Medicine

## 2014-04-08 VITALS — BP 114/80 | HR 98 | Temp 98.6°F | Resp 18 | Ht 63.5 in | Wt 218.0 lb

## 2014-04-08 DIAGNOSIS — O99345 Other mental disorders complicating the puerperium: Secondary | ICD-10-CM

## 2014-04-08 DIAGNOSIS — F3289 Other specified depressive episodes: Secondary | ICD-10-CM

## 2014-04-08 DIAGNOSIS — R5383 Other fatigue: Principal | ICD-10-CM

## 2014-04-08 DIAGNOSIS — F53 Postpartum depression: Secondary | ICD-10-CM

## 2014-04-08 DIAGNOSIS — R5381 Other malaise: Secondary | ICD-10-CM

## 2014-04-08 DIAGNOSIS — F329 Major depressive disorder, single episode, unspecified: Secondary | ICD-10-CM

## 2014-04-08 HISTORY — DX: Postpartum depression: F53.0

## 2014-04-08 HISTORY — DX: Other mental disorders complicating the puerperium: O99.345

## 2014-04-08 NOTE — Progress Notes (Signed)
   Subjective:    Patient ID: Jasmine Espinoza, female    DOB: 07/24/1986, 28 y.o.   MRN: [id number]  HPI Comments: 28 yo WF to establish as new patient. She was started on Lexapro 6 weeks after 2nd baby who is now 569 months old. She noted she tried to come off RX and started getting headaches and increased depression. She restarted RX and noted symptoms have improved. She notes only mild fatigue.     Medication List       This list is accurate as of: 04/08/14  5:19 PM.  Always use your most recent med list.               escitalopram 10 MG tablet  Commonly known as:  LEXAPRO  Take 10 mg by mouth daily.     MIRENA 20 MCG/24HR IUD  Generic drug:  levonorgestrel  1 each by Intrauterine route once.         Review of Systems  Constitutional: Positive for fatigue.  All other systems reviewed and are negative.  BP 114/80  Pulse 98  Temp(Src) 98.6 F (37 C) (Temporal)  Resp 18  Ht 5' 3.5" (1.613 m)  Wt 218 lb (98.884 kg)  BMI 38.01 kg/m2     Objective:   Physical Exam  Nursing note and vitals reviewed. Constitutional: She is oriented to person, place, and time. She appears well-developed and well-nourished. No distress.  obese  HENT:  Head: Normocephalic and atraumatic.  Right Ear: External ear normal.  Left Ear: External ear normal.  Nose: Nose normal.  Eyes: Conjunctivae and EOM are normal.  Neck: Normal range of motion. Neck supple. No JVD present. No thyromegaly present.  Cardiovascular: Normal rate, regular rhythm, normal heart sounds and intact distal pulses.   Pulmonary/Chest: Effort normal and breath sounds normal.  Abdominal: Soft. Bowel sounds are normal. She exhibits no distension and no mass. There is no tenderness. There is no rebound and no guarding.  Musculoskeletal: Normal range of motion. She exhibits no edema and no tenderness.  Lymphadenopathy:    She has no cervical adenopathy.  Neurological: She is alert and oriented to person, place, and time.  No cranial nerve deficit.  Skin: Skin is warm and dry. No rash noted. No erythema. No pallor.  Psychiatric: She has a normal mood and affect. Her behavior is normal. Judgment and thought content normal.          Assessment & Plan:  1. New patient to establish-  2. Fatigue- check labs, increase activity and H2O   3. Depression- advised continue Lexapro until baby 1 year Birthday and then start weaning off over 2 week period.

## 2014-04-08 NOTE — Patient Instructions (Signed)

## 2014-04-09 LAB — BASIC METABOLIC PANEL WITH GFR
BUN: 9 mg/dL (ref 6–23)
CALCIUM: 9.1 mg/dL (ref 8.4–10.5)
CO2: 22 mEq/L (ref 19–32)
Chloride: 106 mEq/L (ref 96–112)
Creat: 0.58 mg/dL (ref 0.50–1.10)
GFR, Est African American: >89 mL/min
GLUCOSE: 96 mg/dL (ref 70–99)
Potassium: 4.2 mEq/L (ref 3.5–5.3)
Sodium: 137 mEq/L (ref 135–145)

## 2014-04-09 LAB — CBC WITH DIFFERENTIAL/PLATELET
Basophils Absolute: 0 10*3/uL (ref 0.0–0.1)
Basophils Relative: 0 % (ref 0–1)
Eosinophils Absolute: 0.1 10*3/uL (ref 0.0–0.7)
Eosinophils Relative: 2 % (ref 0–5)
HCT: 40.2 % (ref 36.0–46.0)
HEMOGLOBIN: 13.4 g/dL (ref 12.0–15.0)
LYMPHS ABS: 1.9 10*3/uL (ref 0.7–4.0)
Lymphocytes Relative: 34 % (ref 12–46)
MCH: 27.1 pg (ref 26.0–34.0)
MCHC: 33.3 g/dL (ref 30.0–36.0)
MCV: 81.2 fL (ref 78.0–100.0)
MONOS PCT: 12 % (ref 3–12)
Monocytes Absolute: 0.7 10*3/uL (ref 0.1–1.0)
NEUTROS PCT: 52 % (ref 43–77)
Neutro Abs: 2.9 10*3/uL (ref 1.7–7.7)
Platelets: 252 10*3/uL (ref 150–400)
RBC: 4.95 MIL/uL (ref 3.87–5.11)
RDW: 14.6 % (ref 11.5–15.5)
WBC: 5.5 10*3/uL (ref 4.0–10.5)

## 2014-04-09 LAB — FOLATE RBC: RBC Folate: 304 ng/mL (ref >280)

## 2014-04-09 LAB — HEPATIC FUNCTION PANEL
ALT: 13 U/L (ref 0–35)
AST: 15 U/L (ref 0–37)
Albumin: 4.1 g/dL (ref 3.5–5.2)
Alkaline Phosphatase: 106 U/L (ref 39–117)
Bilirubin, Direct: 0.1 mg/dL (ref 0.0–0.3)
Indirect Bilirubin: 0.1 mg/dL — ABNORMAL LOW (ref 0.2–1.2)
TOTAL PROTEIN: 7 g/dL (ref 6.0–8.3)
Total Bilirubin: 0.2 mg/dL (ref 0.2–1.2)

## 2014-04-09 LAB — IRON AND TIBC
%SAT: 14 % — ABNORMAL LOW (ref 20–55)
IRON: 48 ug/dL (ref 42–145)
TIBC: 344 ug/dL (ref 250–470)
UIBC: 296 ug/dL (ref 125–400)

## 2014-04-09 LAB — VITAMIN D 25 HYDROXY (VIT D DEFICIENCY, FRACTURES): Vit D, 25-Hydroxy: 43 ng/mL (ref 30–89)

## 2014-04-09 LAB — VITAMIN B12: Vitamin B-12: 594 pg/mL (ref 211–911)

## 2014-04-09 LAB — MAGNESIUM: MAGNESIUM: 2 mg/dL (ref 1.5–2.5)

## 2014-04-09 LAB — INSULIN, FASTING: Insulin fasting, serum: 57 u[IU]/mL — ABNORMAL HIGH (ref 3–28)

## 2014-04-09 LAB — TSH: TSH: 1.073 u[IU]/mL (ref 0.350–4.500)

## 2014-04-26 ENCOUNTER — Encounter: Payer: Self-pay | Admitting: *Deleted

## 2014-07-27 ENCOUNTER — Encounter: Payer: Self-pay | Admitting: Physician Assistant

## 2014-07-27 ENCOUNTER — Ambulatory Visit (INDEPENDENT_AMBULATORY_CARE_PROVIDER_SITE_OTHER): Payer: 59 | Admitting: Physician Assistant

## 2014-07-27 VITALS — BP 128/80 | HR 76 | Temp 98.5°F | Resp 16 | Ht 64.0 in | Wt 222.0 lb

## 2014-07-27 DIAGNOSIS — R6889 Other general symptoms and signs: Secondary | ICD-10-CM

## 2014-07-27 DIAGNOSIS — Z0001 Encounter for general adult medical examination with abnormal findings: Secondary | ICD-10-CM

## 2014-07-27 DIAGNOSIS — E669 Obesity, unspecified: Secondary | ICD-10-CM | POA: Insufficient documentation

## 2014-07-27 LAB — CBC WITH DIFFERENTIAL/PLATELET
Basophils Absolute: 0 10*3/uL (ref 0.0–0.1)
Basophils Relative: 0 % (ref 0–1)
EOS PCT: 1 % (ref 0–5)
Eosinophils Absolute: 0.1 10*3/uL (ref 0.0–0.7)
HEMATOCRIT: 42.1 % (ref 36.0–46.0)
HEMOGLOBIN: 13.9 g/dL (ref 12.0–15.0)
LYMPHS ABS: 2 10*3/uL (ref 0.7–4.0)
LYMPHS PCT: 33 % (ref 12–46)
MCH: 27.4 pg (ref 26.0–34.0)
MCHC: 33 g/dL (ref 30.0–36.0)
MCV: 82.9 fL (ref 78.0–100.0)
MONO ABS: 0.5 10*3/uL (ref 0.1–1.0)
MONOS PCT: 9 % (ref 3–12)
Neutro Abs: 3.5 10*3/uL (ref 1.7–7.7)
Neutrophils Relative %: 57 % (ref 43–77)
PLATELETS: 246 10*3/uL (ref 150–400)
RBC: 5.08 MIL/uL (ref 3.87–5.11)
RDW: 13.7 % (ref 11.5–15.5)
WBC: 6.1 10*3/uL (ref 4.0–10.5)

## 2014-07-27 MED ORDER — ESCITALOPRAM OXALATE 10 MG PO TABS: 10.0000 mg | ORAL_TABLET | Freq: Every day | ORAL | Status: DC

## 2014-07-27 NOTE — Progress Notes (Signed)
Complete Physical  Assessment and Plan: TMJ -information given to the patient, no gum/decrease hard foods, warm wet wash clothes, decrease stress, talk with dentist about possible night guard, can do massage, and exercise.  Cough, nonproductive- likely viral, treat symptoms, will check CBC, if not better will call the office.  Obesity with co morbidities- long discussion about weight loss, diet, and exercise Depression/Anxiety- continue medications, stress management techniques discussed, increase water, good sleep hygiene discussed, increase exercise, and increase veggies.  Health Maintenance  Discussed med's effects and SE's. Screening labs and tests as requested with regular follow-up as recommended.  HPI  28 y.o. female  presents for a complete physical.   Her blood pressure has been controlled at home, today their BP is BP: 128/80 mmHg She does not workout regularly but would like to start exercising more.   She denies chest pain, shortness of breath, dizziness.  Patient is on Vitamin D supplement.   Lab Results  Component Value Date   VD25OH 43 04/08/2014   BMI is Body mass index is 38.09 kg/(m^2). Wt Readings from Last 3 Encounters:  07/27/14 222 lb (100.699 kg)  04/08/14 218 lb (98.884 kg)  07/08/13 212 lb (96.163 kg)     She has a 41926 year old and a 27 year old, both girls. She works in Research [employer], [employer] and wants to go back for [employer], she is in the prereq for it now.  She is on lexapro for anxiety/depression and states she is doing well on it and would like off of it eventually.  [place] [employer], has been having sore throat, cough.   Current Medications:  Current Outpatient Prescriptions on File Prior to Visit  Medication Sig Dispense Refill  . escitalopram (LEXAPRO) 10 MG tablet Take 10 mg by mouth daily.      Marland Kitchen. levonorgestrel (MIRENA) 20 MCG/24HR IUD 1 each by Intrauterine route once.       No current facility-administered medications on file prior to visit.    Health Maintenance:   Immunization History  Administered Date(s) Administered  . Tdap 04/24/2013   Tetanus: 2014 Pneumovax: Flu vaccine: Zostavax: LMP: Pap: MGM:  DEXA: Colonoscopy: EGD: Last Dental Exam: Last Eye Exam:  Patient Care Team: Lucky CowboyWilliam McKeown, MD as PCP - General (Internal Medicine) Turner Danielsavid C Lowe, MD as Consulting Physician (Obstetrics and Gynecology)  Allergies: No Known Allergies Medical History:  Past Medical History  Diagnosis Date  . Abnormal Pap smear   . Hx of blood clots 2015    on uterus during pregnancy  . Postpartum depression 04/08/2014   Surgical History:  Past Surgical History  Procedure Laterality Date  . Wisdom tooth extraction    . Colposcopy  2014   Family History:  Family History  Problem Relation Age of Onset  . Cancer Maternal Grandmother     breast, late 5840s  . Lupus Paternal Grandfather   . Hearing loss Paternal Grandfather    Social History:  History  Substance Use Topics  . Smoking status: Never Smoker   . Smokeless tobacco: Not on file  . Alcohol Use: No    Review of Systems: [X]  = complains of  [ ]  = denies  General: Fatigue [ ]  Fever [ ]  Chills [ ]  Weakness [ ]   Insomnia [ ] Weight change [ ]  Night sweats [ ]   Change in appetite [ ]  Head: Head Trauma [ ]  Eyes: Wears glasses or corrective lens [ ]  Redness [ ]  Blurred vision [ ]  Diplopia [ ]   Discharge [ ]  Floaters [ ]  UJW:JXBJYNWENT:Earache [ ]  hearing loss [ ]  Tinnitus [ ]  Ear Discharge [ ]   Congestion [ ]  Sinus Pain [ ]  Post Nasal Drip [ ]  Nose Bleeds [ ]  Rhinorrhea [ ]    Difficulty Swallowing [ ]  Snoring [ ]  Sore Throat [ ]  Cardiac:   Chest pain/pressure [ ]  SOB [ ]  Orthopnea [ ]   Palpitations [ ]   Paroxysmal nocturnal dyspnea[ ]  Claudication [ ]  Edema [ ]  Difficulty walking around block or climbing stairs [ ]  Pulmonary: Cough [x ] Wheezing[ ]   SOB [ ]   Pleurisy [ ]  Asthma [ ]  GI: Nausea [ ]  Vomiting[ ]  Dysphagia[ ]  Heartburn[ ]  Abdominal pain [ ]  Constipation [ ] ; Diarrhea  [ ]  BRBPR [ ]  Melena[ ]  Bloating [ ]  Hemorrhoids [ ]  Incontinence [ ]  GU: Hematuria[ ]  Dysuria [ ]  Nocturia[ ]  Urgency [ ]   Hesitancy [ ]  Discharge [ ]  Frequency [ ]  Incontinence [ ]  Breast:  Dimpling [ ]  Breast lumps [ ]   Breast Lesions [ ]  Nipple discharge [ ]    Neuro: Headaches[x ] Vertigo[ ]  Paresthesias[ ]  Spasm [ ]  Speech changes [ ]  Incoordination [ ]  Dizziness [ ]  Numbness [ ]  Ortho: Arthritis [ ]  Joint pain [ ]  Muscle pain [ ]  Joint swelling [ ]  Back Pain [ ]  Weakness [ ]  Stiffness [ ]  Skin:  Rash [ ]   Pruritis [ ]  Change in skin lesion [ ]  Change in hair [ ]  Change in nails [ ]  Psych: Depression[ ]  Anxiety[ ]  Stress [ ]  Confusion [ ]  Memory loss [ ]   Heme/Lymph: Bleeding [ ]  Bruising [ ]  History of anemia [ ]  Enlarged lymph nodes [ ]   Endocrine: Visual blurring [ ]  Paresthesia [ ]  Polyuria [ ]  Polydipsia [ ]  Polyphagia [ ]  Heat/cold intolerance [ ]  Hypoglycemia [ ]  Thyroid Issues [ ]  Diabetes [ ]   Physical Exam: Estimated body mass index is 38.09 kg/(m^2) as calculated from the following:   Height as of this encounter: 5\' 4"  (1.626 m).   Weight as of this encounter: 222 lb (100.699 kg). BP 128/80  Pulse 76  Temp(Src) 98.5 F (36.9 C)  Resp 16  Ht 5\' 4"  (1.626 m)  Wt 222 lb (100.699 kg)  BMI 38.09 kg/m2  Breastfeeding? No General Appearance: Well nourished, in no apparent distress.  Eyes: PERRLA, EOMs, conjunctiva no swelling or erythema, normal fundi and vessels.  Sinuses: No Frontal/maxillary tenderness  ENT/Mouth: Ext aud canals clear, normal light reflex with TMs without erythema, bulging. Good dentition. No erythema, swelling, or exudate on post pharynx. Tonsils not swollen or erythematous. Hearing normal.  Neck: Supple, thyroid normal. No bruits  Respiratory: Respiratory effort normal, BS equal bilaterally without rales, rhonchi, wheezing or stridor.  Cardio: RRR without murmurs, rubs or gallops. Brisk peripheral pulses without edema.  Chest: symmetric, with normal  excursions and percussion.  Breasts:defer Abdomen: Soft, nontender, obese, no guarding, rebound, hernias, masses, or organomegaly. .  Lymphatics: Non tender without lymphadenopathy.  Genitourinary: defer Musculoskeletal: Full ROM all peripheral extremities,5/5 strength, and normal gait.  Skin: Warm, dry without rashes, lesions, ecchymosis. Neuro: Cranial nerves intact, reflexes equal bilaterally. Normal muscle tone, no cerebellar symptoms. Sensation intact.  Psych: Awake and oriented X 3, normal affect, Insight and Judgment appropriate.   EKG: WNL , T wave inversion V1,V2   Quentin MullingCollier, Ascencion Coye 3:39 PM [facility]

## 2014-07-27 NOTE — Patient Instructions (Addendum)
Claritin or loratadine cheapest but likely the weakest  Zyrtec or certizine at night because it can make you sleepy The strongest is allegra or fexafinadine  Cheapest at [employer], [employer], [employer]  What is the TMJ? The temporomandibular (tem-PUH-ro-man-DIB-yoo-ler) joint, or the TMJ, connects the upper and lower jawbones. This joint allows the jaw to open wide and move back and forth when you chew, talk, or yawn.There are also several muscles that help this joint move. There can be muscle tightness and pain in the muscle that can cause several symptoms.  What causes TMJ pain? There are many causes of TMJ pain. Repeated chewing (for example, chewing gum) and clenching your teeth can cause pain in the joint. Some TMJ pain has no obvious cause. What can I do to ease the pain? There are many things you can do to help your pain get better. When you have pain:  Eat soft foods and stay away from chewy foods (for example, taffy) Try to use both sides of your mouth to chew Don't chew gum Don't open your mouth wide (for example, during yawning or singing) Don't bite your cheeks or fingernails Lower your amount of stress and worry Applying a warm, damp washcloth to the joint may help. Over-the-counter pain medicines such as ibuprofen (one brand: Advil) or acetaminophen (one brand: Tylenol) might also help. Do not use these medicines if you are allergic to them or if your doctor told you not to use them. How can I stop the pain from coming back? When your pain is better, you can do these exercises to make your muscles stronger and to keep the pain from coming back:  Resisted mouth opening: Place your thumb or two fingers under your chin and open your mouth slowly, pushing up lightly on your chin with your thumb. Hold for three to six seconds. Close your mouth slowly. Resisted mouth closing: Place your thumbs under your chin and your two index fingers on the ridge between your mouth and the bottom of your  chin. Push down lightly on your chin as you close your mouth. Tongue up: Slowly open and close your mouth while keeping the tongue touching the roof of the mouth. Side-to-side jaw movement: Place an object about one fourth of an inch thick (for example, two tongue depressors) between your front teeth. Slowly move your jaw from side to side. Increase the thickness of the object as the exercise becomes easier Forward jaw movement: Place an object about one fourth of an inch thick between your front teeth and move the bottom jaw forward so that the bottom teeth are in front of the top teeth. Increase the thickness of the object as the exercise becomes easier. These exercises should not be painful. If it hurts to do these exercises, stop doing them and talk to your family doctor.     Bad carbs also include fruit juice, alcohol, and sweet tea. These are empty calories that do not signal to your brain that you are full.   Please remember the good carbs are still carbs which convert into sugar. So please measure them out no more than 1/2-1 cup of rice, oatmeal, pasta, and beans.  Veggies are however free foods! Pile them on.   I like lean protein at every meal such as chicken, Malaw[place]key, pork chops, cottage cheese, etc. Just do not fry these meats and please center your meal around vegetable, the meats should be a side dish.   No all fruit is created equal. Please  see the list below, the fruit at the bottom is higher in sugars than the fruit at the top   We want weight loss that will last so you should lose 1-2 pounds a week.  THAT IS IT! Please pick THREE things a month to change. Once it is a habit check off the item. Then pick another three items off the list to become habits.  If you are already doing a habit on the list GREAT!  Cross that item off! o Don't drink your calories. Ie, alcohol, soda, fruit juice, and sweet tea.  o Drink more water. Drink a glass when you feel hungry or before each meal.    o Eat breakfast - Complex carb and protein (likeDannon light and fit yogurt, oatmeal, fruit, eggs, [place]key bacon). o Measure your cereal.  Eat no more than one cup a day. (ie [place]ashi) o Eat an apple a day. o Add a vegetable a day. o Try a new vegetable a month. o Use Pam! Stop using oil or butter to cook. o Don't finish your plate or use smaller plates. o Share your dessert. o Eat sugar free Jello for dessert or frozen grapes. o Don't eat 2-3 hours before bed. o Switch to whole wheat bread, pasta, and brown rice. o Make healthier choices when you eat out. No fries! o Pick baked chicken, NOT fried. o Don't forget to SLOW DOWN when you eat. It is not going anywhere.  o Take the stairs. o Park far away in the parking lot o [employer] soup cans (or weights) for 10 minutes while watching TV. o Walk at work for 10 minutes during break. o Walk outside 1 time a week with your friend, kids, dog, or significant other. o Start a walking group at church. o Walk the mall as much as you can tolerate.  o Keep a food diary. o Weigh yourself daily. o Walk for 15 minutes 3 days per week. o Cook at home more often and eat out less.  If life happens and you go back to old habits, it is okay.  Just start over. You can do it!   If you experience chest pain, get short of breath, or tired during the exercise, please stop immediately and inform your doctor.

## 2014-07-28 ENCOUNTER — Ambulatory Visit: Payer: Self-pay | Admitting: Physician Assistant

## 2014-07-28 LAB — HEPATIC FUNCTION PANEL
ALBUMIN: 4.5 g/dL (ref 3.5–5.2)
ALK PHOS: 96 U/L (ref 39–117)
ALT: 15 U/L (ref 0–35)
AST: 17 U/L (ref 0–37)
BILIRUBIN TOTAL: 0.3 mg/dL (ref 0.2–1.2)
Bilirubin, Direct: 0.1 mg/dL (ref 0.0–0.3)
Indirect Bilirubin: 0.2 mg/dL (ref 0.2–1.2)
Total Protein: 7.3 g/dL (ref 6.0–8.3)

## 2014-07-28 LAB — BASIC METABOLIC PANEL WITH GFR
BUN: 7 mg/dL (ref 6–23)
CHLORIDE: 105 meq/L (ref 96–112)
CO2: 21 meq/L (ref 19–32)
CREATININE: 0.6 mg/dL (ref 0.50–1.10)
Calcium: 9.1 mg/dL (ref 8.4–10.5)
GFR, Est African American: >89 mL/min
GFR, Est Non African American: >89 mL/min
GLUCOSE: 81 mg/dL (ref 70–99)
Potassium: 4.2 mEq/L (ref 3.5–5.3)
Sodium: 136 mEq/L (ref 135–145)

## 2014-07-28 LAB — MICROALBUMIN / CREATININE URINE RATIO
CREATININE, URINE: 293.8 mg/dL
MICROALB UR: 1.7 mg/dL (ref <2.0)
MICROALB/CREAT RATIO: 5.8 mg/g (ref 0.0–30.0)

## 2014-07-28 LAB — LIPID PANEL
CHOL/HDL RATIO: 3.7 ratio
Cholesterol: 127 mg/dL (ref 0–200)
HDL: 34 mg/dL — ABNORMAL LOW (ref >39)
LDL Cholesterol: 64 mg/dL (ref 0–99)
Triglycerides: 144 mg/dL (ref <150)
VLDL: 29 mg/dL (ref 0–40)

## 2014-07-28 LAB — HEMOGLOBIN A1C
HEMOGLOBIN A1C: 5.6 % (ref <5.7)
Mean Plasma Glucose: 114 mg/dL (ref <117)

## 2014-07-28 LAB — URINALYSIS, ROUTINE W REFLEX MICROSCOPIC
Bilirubin Urine: NEGATIVE
GLUCOSE, UA: NEGATIVE mg/dL
Ketones, ur: NEGATIVE mg/dL
LEUKOCYTES UA: NEGATIVE
Nitrite: NEGATIVE
Protein, ur: NEGATIVE mg/dL
SPECIFIC GRAVITY, URINE: 1.026 (ref 1.005–1.030)
Urobilinogen, UA: 0.2 mg/dL (ref 0.0–1.0)
pH: 5 (ref 5.0–8.0)

## 2014-07-28 LAB — URINALYSIS, MICROSCOPIC ONLY
CRYSTALS: NONE SEEN
Casts: NONE SEEN
SQUAMOUS EPITHELIAL / LPF: NONE SEEN

## 2014-07-28 LAB — IRON AND TIBC
%SAT: 18 % — ABNORMAL LOW (ref 20–55)
Iron: 62 ug/dL (ref 42–145)
TIBC: 347 ug/dL (ref 250–470)
UIBC: 285 ug/dL (ref 125–400)

## 2014-07-28 LAB — INSULIN, FASTING: Insulin fasting, serum: 17.7 u[IU]/mL (ref 2.0–19.6)

## 2014-07-28 LAB — MAGNESIUM: Magnesium: 1.7 mg/dL (ref 1.5–2.5)

## 2014-07-28 LAB — FERRITIN: Ferritin: 49 ng/mL (ref 10–291)

## 2014-07-28 LAB — VITAMIN D 25 HYDROXY (VIT D DEFICIENCY, FRACTURES): Vit D, 25-Hydroxy: 43 ng/mL (ref 30–89)

## 2014-07-28 LAB — VITAMIN B12: VITAMIN B 12: 513 pg/mL (ref 211–911)

## 2014-07-28 LAB — TSH: TSH: 1.931 u[IU]/mL (ref 0.350–4.500)

## 2014-08-09 ENCOUNTER — Encounter: Payer: Self-pay | Admitting: Physician Assistant

## 2015-02-02 ENCOUNTER — Ambulatory Visit (INDEPENDENT_AMBULATORY_CARE_PROVIDER_SITE_OTHER): Payer: 59 | Admitting: *Deleted

## 2015-02-02 DIAGNOSIS — Z111 Encounter for screening for respiratory tuberculosis: Secondary | ICD-10-CM

## 2015-02-03 ENCOUNTER — Other Ambulatory Visit: Payer: Self-pay | Admitting: Physician Assistant

## 2015-02-03 DIAGNOSIS — Z8619 Personal history of other infectious and parasitic diseases: Secondary | ICD-10-CM

## 2015-02-04 ENCOUNTER — Other Ambulatory Visit: Payer: 59

## 2015-02-04 DIAGNOSIS — Z8619 Personal history of other infectious and parasitic diseases: Secondary | ICD-10-CM

## 2015-02-07 LAB — VARICELLA ZOSTER ANTIBODY, IGG: VARICELLA IGG: 3666 {index} — AB (ref <135.00)

## 2015-03-15 ENCOUNTER — Ambulatory Visit (INDEPENDENT_AMBULATORY_CARE_PROVIDER_SITE_OTHER): Payer: 59 | Admitting: *Deleted

## 2015-03-15 DIAGNOSIS — Z111 Encounter for screening for respiratory tuberculosis: Secondary | ICD-10-CM

## 2015-03-24 ENCOUNTER — Ambulatory Visit (INDEPENDENT_AMBULATORY_CARE_PROVIDER_SITE_OTHER): Payer: 59 | Admitting: Internal Medicine

## 2015-03-24 ENCOUNTER — Encounter: Payer: Self-pay | Admitting: Internal Medicine

## 2015-03-24 VITALS — BP 126/84 | HR 78 | Temp 98.2°F | Resp 18 | Ht 64.0 in | Wt 233.0 lb

## 2015-03-24 DIAGNOSIS — F32A Depression, unspecified: Secondary | ICD-10-CM

## 2015-03-24 DIAGNOSIS — Z79899 Other long term (current) drug therapy: Secondary | ICD-10-CM

## 2015-03-24 DIAGNOSIS — E559 Vitamin D deficiency, unspecified: Secondary | ICD-10-CM

## 2015-03-24 DIAGNOSIS — R5383 Other fatigue: Secondary | ICD-10-CM

## 2015-03-24 DIAGNOSIS — E669 Obesity, unspecified: Secondary | ICD-10-CM

## 2015-03-24 DIAGNOSIS — F329 Major depressive disorder, single episode, unspecified: Secondary | ICD-10-CM

## 2015-03-24 DIAGNOSIS — R7309 Other abnormal glucose: Secondary | ICD-10-CM

## 2015-03-24 MED ORDER — TRAZODONE HCL 50 MG PO TABS: ORAL_TABLET | ORAL | Status: DC

## 2015-03-24 NOTE — Progress Notes (Signed)
   Subjective:    Patient ID: Jasmine Espinoza, female    DOB: Dec 02, 1985, 29 y.o.   MRN: [id number]  HPI  Patient reports to the office for evaluation of insomnia and problems with depression.  She reports that over the past 4-6 months she has been having a hard time falling asleep and staying asleep.  She reports that she is really tired and foggy throughout the day and she has been gaining a lot of weight and has been having a lot of stress.  She reports that she isn't eating great but she feels like she keeps gaining weight despite her activity.  She reports that the sleep started slightly before the mood swings but not much.  She reports that she is having a lot more acne as well.  She has not tried anything to help her sleep.    Review of Systems     Objective:   Physical Exam  Constitutional: She is oriented to person, place, and time. She appears well-developed and well-nourished. No distress.  HENT:  Head: Normocephalic and atraumatic.  Mouth/Throat: Oropharynx is clear and moist. No oropharyngeal exudate.  Eyes: Conjunctivae are normal. No scleral icterus.  Neck: Normal range of motion. Neck supple. No JVD present. No thyromegaly present.  Cardiovascular: Normal rate, regular rhythm, normal heart sounds and intact distal pulses.  Exam reveals no gallop and no friction rub.   No murmur heard. Pulmonary/Chest: Effort normal. No respiratory distress. She has no wheezes. She has no rales. She exhibits no tenderness.  Abdominal: Soft. Bowel sounds are normal. She exhibits no distension and no mass. There is no tenderness. There is no rebound and no guarding.  Musculoskeletal: Normal range of motion.  Lymphadenopathy:    She has no cervical adenopathy.  Neurological: She is alert and oriented to person, place, and time.  Skin: Skin is warm and dry. She is not diaphoretic.  Psychiatric: She has a normal mood and affect. Her behavior is normal. Judgment and thought content normal.   Nursing note and vitals reviewed.         Assessment & Plan:    1. Obesity -diet and exercise - Lipid panel  2. Depression -consider increasing lexapro to 20 mg if labs normal or switch to zoloft  3. Medication management  - CBC with Differential/Platelet - BASIC METABOLIC PANEL WITH GFR - Hepatic function panel - Magnesium  4. Other fatigue -due to insomnia -try trazedone - TSH  5. Vitamin D deficiency -start supplement - Vit D  25 hydroxy (rtn osteoporosis monitoring)  6. Abnormal glucose  - Hemoglobin A1c - Insulin, random

## 2015-03-24 NOTE — Patient Instructions (Signed)
Ways to cut 100 calories  1. Eat your eggs with hot sauce OR salsa instead of cheese.  Eggs are great for breakfast, but many people consider eggs and cheese to be BFFs. Instead of cheese-1 oz. of cheddar has 114 calories-top your eggs with hot sauce, which contains no calories and helps with satiety and metabolism. Salsa is also a great option!!  2. Top your toast, waffles or pancakes with mashed berries instead of jelly or syrup. Half a cup of berries-fresh, frozen or thawed-has about 40 calories, compared with 2 tbsp. of maple syrup or jelly, which both have about 100 calories. The berries will also give you a good punch of fiber, which helps keep you full and satisfied and won't spike blood sugar quickly like the jelly or syrup. 3. Swap the non-fat latte for black coffee with a splash of half-and-half. Contrary to its name, that non-fat latte has 130 calories and a startling 19g of carbohydrates per 16 oz. serving. Replacing that 'light' drinkable dessert with a black coffee with a splash of half-and-half saves you more than 100 calories per 16 oz. serving. 4. Sprinkle salads with freeze-dried raspberries instead of dried cranberries. If you want a sweet addition to your nutritious salad, stay away from dried cranberries. They have a whopping 130 calories per  cup and 30g carbohydrates. Instead, sprinkle freeze-dried raspberries guilt-free and save more than 100 calories per  cup serving, adding 3g of belly-filling fiber. 5. Go for mustard in place of mayo on your sandwich. Mustard can add really nice flavor to any sandwich, and there are tons of varieties, from spicy to honey. A serving of mayo is 95 calories, versus 10 calories in a serving of mustard. 6. Choose a DIY salad dressing instead of the store-bought kind. Mix Dijon or whole grain mustard with low-fat Kefir or red wine vinegar and garlic. 7. Use hummus as a spread instead of a dip. Use hummus as a spread on a high-fiber cracker or  tortilla with a sandwich and save on calories without sacrificing taste. 8. Pick just one salad "accessory." Salad isn't automatically a calorie winner. It's easy to over-accessorize with toppings. Instead of topping your salad with nuts, avocado and cranberries (all three will clock in at 313 calories), just pick one. The next day, choose a different accessory, which will also keep your salad interesting. You don't wear all your jewelry every day, right? 9. Ditch the white pasta in favor of spaghetti squash. One cup of cooked spaghetti squash has about 40 calories, compared with traditional spaghetti, which comes with more than 200. Spaghetti squash is also nutrient-dense. It's a good source of fiber and Vitamins A and C, and it can be eaten just like you would eat pasta-with a great tomato sauce and turkey meatballs or with pesto, tofu and spinach, for example. 10. Dress up your chili, soups and stews with non-fat Greek yogurt instead of sour cream. Just a 'dollop' of sour cream can set you back 115 calories and a whopping 12g of fat-seven of which are of the artery-clogging variety. Added bonus: Greek yogurt is packed with muscle-building protein, calcium and B Vitamins. 11. Mash cauliflower instead of mashed potatoes. One cup of traditional mashed potatoes-in all their creamy goodness-has more than 200 calories, compared to mashed cauliflower, which you can typically eat for less than 100 calories per 1 cup serving. Cauliflower is a great source of the antioxidant indole-3-carbinol (I3C), which may help reduce the risk of some cancers, like breast   cancer. 12. Ditch the ice cream sundae in favor of a Greek yogurt parfait. Instead of a cup of ice cream or fro-yo for dessert, try 1 cup of nonfat Greek yogurt topped with fresh berries and a sprinkle of cacao nibs. Both toppings are packed with antioxidants, which can help reduce cellular inflammation and oxidative damage. And the comparison is a  no-brainer: One cup of ice cream has about 275 calories; one cup of frozen yogurt has about 230; and a cup of Greek yogurt has just 130, plus twice the protein, so you're less likely to return to the freezer for a second helping. 13. Put olive oil in a spray container instead of using it directly from the bottle. Each tablespoon of olive oil is 120 calories and 15g of fat. Use a mister instead of pouring it straight into the pan or onto a salad. This allows for portion control and will save you more than 100 calories. 14. When baking, substitute canned pumpkin for butter or oil. Canned pumpkin-not pumpkin pie mix-is loaded with Vitamin A, which is important for skin and eye health, as well as immunity. And the comparisons are pretty crazy:  cup of canned pumpkin has about 40 calories, compared to butter or oil, which has more than 800 calories. Yes, 800 calories. Applesauce and mashed banana can also serve as good substitutions for butter or oil, usually in a 1:1 ratio. 15. Top casseroles with high-fiber cereal instead of breadcrumbs. Breadcrumbs are typically made with white bread, while breakfast cereals contain 5-9g of fiber per serving. Not only will you save more than 150 calories per  cup serving, the swap will also keep you more full and you'll get a metabolism boost from the added fiber. 16. Snack on pistachios instead of macadamia nuts. Believe it or not, you get the same amount of calories from 35 pistachios (100 calories) as you would from only five macadamia nuts. 17. Chow down on kale chips rather than potato chips. This is my favorite 'don't knock it 'till you try it' swap. Kale chips are so easy to make at home, and you can spice them up with a little grated parmesan or chili powder. Plus, they're a mere fraction of the calories of potato chips, but with the same crunch factor we crave so often. 18. Add seltzer and some fruit slices to your cocktail instead of soda or fruit juice. One  cup of soda or fruit juice can pack on as much as 140 calories. Instead, use seltzer and fruit slices. The fruit provides valuable phytochemicals, such as flavonoids and anthocyanins, which help to combat cancer and stave off the aging process.  We want weight loss that will last so you should lose 1-2 pounds a week.  THAT IS IT! Please pick THREE things a month to change. Once it is a habit check off the item. Then pick another three items off the list to become habits.  If you are already doing a habit on the list GREAT!  Cross that item off! o Don't drink your calories. Ie, alcohol, soda, fruit juice, and sweet tea.  o Drink more water. Drink a glass when you feel hungry or before each meal.  o Eat breakfast - Complex carb and protein (likeDannon light and fit yogurt, oatmeal, fruit, eggs, turkey bacon). o Measure your cereal.  Eat no more than one cup a day. (ie Kashi) o Eat an apple a day. o Add a vegetable a day. o Try a new vegetable   a month. o Use Pam! Stop using oil or butter to cook. o Don't finish your plate or use smaller plates. o Share your dessert. o Eat sugar free Jello for dessert or frozen grapes. o Don't eat 2-3 hours before bed. o Switch to whole wheat bread, pasta, and brown rice. o Make healthier choices when you eat out. No fries! o Pick baked chicken, NOT fried. o Don't forget to SLOW DOWN when you eat. It is not going anywhere.  o Take the stairs. o Park far away in the parking lot o Lift soup cans (or weights) for 10 minutes while watching TV. o Walk at work for 10 minutes during break. o Walk outside 1 time a week with your friend, kids, dog, or significant other. o Start a walking group at church. o Walk the mall as much as you can tolerate.  o Keep a food diary. o Weigh yourself daily. o Walk for 15 minutes 3 days per week. o Cook at home more often and eat out less.  If life happens and you go back to old habits, it is okay.  Just start over. You can do  it!   If you experience chest pain, get short of breath, or tired during the exercise, please stop immediately and inform your doctor.  

## 2015-03-25 LAB — BASIC METABOLIC PANEL WITH GFR
BUN: 8 mg/dL (ref 6–23)
CALCIUM: 9.6 mg/dL (ref 8.4–10.5)
CO2: 27 mEq/L (ref 19–32)
CREATININE: 0.62 mg/dL (ref 0.50–1.10)
Chloride: 101 mEq/L (ref 96–112)
GFR, Est African American: >89 mL/min
Glucose, Bld: 78 mg/dL (ref 70–99)
Potassium: 4.7 mEq/L (ref 3.5–5.3)
SODIUM: 136 meq/L (ref 135–145)

## 2015-03-25 LAB — MAGNESIUM: Magnesium: 1.9 mg/dL (ref 1.5–2.5)

## 2015-03-25 LAB — CBC WITH DIFFERENTIAL/PLATELET
BASOS ABS: 0 10*3/uL (ref 0.0–0.1)
BASOS PCT: 0 % (ref 0–1)
EOS PCT: 1 % (ref 0–5)
Eosinophils Absolute: 0.1 10*3/uL (ref 0.0–0.7)
HEMATOCRIT: 43.3 % (ref 36.0–46.0)
Hemoglobin: 13.9 g/dL (ref 12.0–15.0)
Lymphocytes Relative: 32 % (ref 12–46)
Lymphs Abs: 2.1 10*3/uL (ref 0.7–4.0)
MCH: 27 pg (ref 26.0–34.0)
MCHC: 32.1 g/dL (ref 30.0–36.0)
MCV: 84.2 fL (ref 78.0–100.0)
MONO ABS: 0.7 10*3/uL (ref 0.1–1.0)
MPV: 10.2 fL (ref 8.6–12.4)
Monocytes Relative: 10 % (ref 3–12)
Neutro Abs: 3.7 10*3/uL (ref 1.7–7.7)
Neutrophils Relative %: 57 % (ref 43–77)
PLATELETS: 264 10*3/uL (ref 150–400)
RBC: 5.14 MIL/uL — ABNORMAL HIGH (ref 3.87–5.11)
RDW: 14.6 % (ref 11.5–15.5)
WBC: 6.5 10*3/uL (ref 4.0–10.5)

## 2015-03-25 LAB — HEMOGLOBIN A1C
Hgb A1c MFr Bld: 5.7 % — ABNORMAL HIGH (ref <5.7)
Mean Plasma Glucose: 117 mg/dL — ABNORMAL HIGH (ref <117)

## 2015-03-25 LAB — HEPATIC FUNCTION PANEL
ALBUMIN: 4.2 g/dL (ref 3.5–5.2)
ALT: 15 U/L (ref 0–35)
AST: 14 U/L (ref 0–37)
Alkaline Phosphatase: 106 U/L (ref 39–117)
BILIRUBIN TOTAL: 0.3 mg/dL (ref 0.2–1.2)
Bilirubin, Direct: 0.1 mg/dL (ref 0.0–0.3)
Indirect Bilirubin: 0.2 mg/dL (ref 0.2–1.2)
Total Protein: 7.7 g/dL (ref 6.0–8.3)

## 2015-03-25 LAB — TSH: TSH: 2.437 u[IU]/mL (ref 0.350–4.500)

## 2015-03-25 LAB — VITAMIN D 25 HYDROXY (VIT D DEFICIENCY, FRACTURES): VIT D 25 HYDROXY: 28 ng/mL — AB (ref 30–100)

## 2015-03-25 LAB — LIPID PANEL
CHOLESTEROL: 131 mg/dL (ref 0–200)
HDL: 26 mg/dL — ABNORMAL LOW (ref >=46)
LDL Cholesterol: 73 mg/dL (ref 0–99)
TRIGLYCERIDES: 160 mg/dL — AB (ref <150)
Total CHOL/HDL Ratio: 5 Ratio
VLDL: 32 mg/dL (ref 0–40)

## 2015-03-25 LAB — INSULIN, RANDOM: Insulin: 16.3 u[IU]/mL (ref 2.0–19.6)

## 2015-07-21 ENCOUNTER — Other Ambulatory Visit: Payer: Self-pay | Admitting: Internal Medicine

## 2015-07-22 ENCOUNTER — Other Ambulatory Visit: Payer: Self-pay | Admitting: Physician Assistant

## 2015-08-01 ENCOUNTER — Encounter: Payer: Self-pay | Admitting: Physician Assistant

## 2015-08-01 ENCOUNTER — Ambulatory Visit (INDEPENDENT_AMBULATORY_CARE_PROVIDER_SITE_OTHER): Payer: 59 | Admitting: Physician Assistant

## 2015-08-01 VITALS — BP 110/66 | HR 111 | Temp 97.5°F | Resp 16 | Ht 63.0 in | Wt 223.0 lb

## 2015-08-01 DIAGNOSIS — E669 Obesity, unspecified: Secondary | ICD-10-CM

## 2015-08-01 DIAGNOSIS — Z79899 Other long term (current) drug therapy: Secondary | ICD-10-CM

## 2015-08-01 DIAGNOSIS — F32A Depression, unspecified: Secondary | ICD-10-CM

## 2015-08-01 DIAGNOSIS — Z23 Encounter for immunization: Secondary | ICD-10-CM | POA: Diagnosis not present

## 2015-08-01 DIAGNOSIS — R590 Localized enlarged lymph nodes: Secondary | ICD-10-CM

## 2015-08-01 DIAGNOSIS — Z1389 Encounter for screening for other disorder: Secondary | ICD-10-CM

## 2015-08-01 DIAGNOSIS — R7309 Other abnormal glucose: Secondary | ICD-10-CM

## 2015-08-01 DIAGNOSIS — E559 Vitamin D deficiency, unspecified: Secondary | ICD-10-CM

## 2015-08-01 DIAGNOSIS — Z0000 Encounter for general adult medical examination without abnormal findings: Secondary | ICD-10-CM

## 2015-08-01 DIAGNOSIS — F329 Major depressive disorder, single episode, unspecified: Secondary | ICD-10-CM

## 2015-08-01 LAB — CBC WITH DIFFERENTIAL/PLATELET
BASOS ABS: 0 10*3/uL (ref 0.0–0.1)
Basophils Relative: 0 % (ref 0–1)
Eosinophils Absolute: 0.1 10*3/uL (ref 0.0–0.7)
Eosinophils Relative: 1 % (ref 0–5)
HEMATOCRIT: 44.3 % (ref 36.0–46.0)
HEMOGLOBIN: 14.2 g/dL (ref 12.0–15.0)
LYMPHS ABS: 2.3 10*3/uL (ref 0.7–4.0)
LYMPHS PCT: 32 % (ref 12–46)
MCH: 26.7 pg (ref 26.0–34.0)
MCHC: 32.1 g/dL (ref 30.0–36.0)
MCV: 83.3 fL (ref 78.0–100.0)
MPV: 10.1 fL (ref 8.6–12.4)
Monocytes Absolute: 0.6 10*3/uL (ref 0.1–1.0)
Monocytes Relative: 9 % (ref 3–12)
NEUTROS PCT: 58 % (ref 43–77)
Neutro Abs: 4.2 10*3/uL (ref 1.7–7.7)
Platelets: 296 10*3/uL (ref 150–400)
RBC: 5.32 MIL/uL — ABNORMAL HIGH (ref 3.87–5.11)
RDW: 14.4 % (ref 11.5–15.5)
WBC: 7.2 10*3/uL (ref 4.0–10.5)

## 2015-08-01 MED ORDER — METRONIDAZOLE 0.75 % EX CREA: TOPICAL_CREAM | Freq: Every day | CUTANEOUS | Status: DC

## 2015-08-01 NOTE — Progress Notes (Signed)
Complete Physical  Assessment and Plan: 1. Obesity - Lipid panel  2. Depression Stop lexapro for now, stress management techniques discussed, increase water, good sleep hygiene discussed, increase exercise, and increase veggies.   3. Abnormal glucose Continue weight loss - CBC with Differential/Platelet - BASIC METABOLIC PANEL WITH GFR - Hepatic function panel - TSH - Hemoglobin A1c - Insulin, fasting  4. Vitamin D deficiency - Vit D  25 hydroxy (rtn osteoporosis monitoring)  5. Medication management - CBC with Differential/Platelet - BASIC METABOLIC PANEL WITH GFR - Hepatic function panel - Magnesium  6. Routine general medical examination at a health care facility - CBC with Differential/Platelet - BASIC METABOLIC PANEL WITH GFR - Hepatic function panel - TSH - Lipid panel - Hemoglobin A1c - Insulin, fasting - Magnesium - Microalbumin / creatinine urine ratio - Urinalysis, Routine w reflex microscopic (not at [facility]) - Vit D  25 hydroxy (rtn osteoporosis monitoring) [facility]7. Posterior cervical lymphadenopathy Nontender, x several months, and with location want to rule out lymphadenopathy - US Soft Tissue Head/Neck; Future  8. Screening for blood or protein in urine - Microalbumin / creatinine urine ratio - Urinalysis, Routine w reflex microscopic (not at [facility])   Discussed med's effects and SE's. Screening labs and tests as requested with regular follow-up as recommended.  HPI  29 y.o. female  presents for a complete physical.   Her blood pressure has been controlled at home, today their BP is BP: 110/66 mmHg She does not workout regularly but would like to start exercising more.   She denies chest pain, shortness of breath, dizziness.  Patient is on Vitamin D supplement.   Lab Results  Component Value Date   VD25OH 28* 03/24/2015   BMI is Body mass index is 39.51 kg/(m^2). Wt Readings from Last 3 Encounters:  08/01/15 223 lb (101.152 kg)  03/24/15 233 lb  (105.688 kg)  07/27/14 222 lb (100.699 kg)     She has insomnia and was put on trazodone last visit, states she is feeling better with this, has stopped her lexapro for several months and states she is feeling fine.  She has noticed a knot on her right mastoid x several months, has increased then decreased in size. Has allergies, on meds, no fevers, chills.  Due to obesity, she has preDM, denies DM poly's.  Lab Results  Component Value Date   HGBA1C 5.7* 03/24/2015   She has a 28 year old and a 28 year old, both girls. 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    Current Medications:  Current Outpatient Prescriptions on File Prior to Visit  Medication Sig Dispense Refill  . escitalopram (LEXAPRO) 10 MG tablet Take 1 tablet (10 mg total) by mouth daily. 90 tablet 3  . levonorgestrel (MIRENA) 20 MCG/24HR IUD 1 each by Intrauterine route once.    . traZODone (DESYREL) 50 MG tablet TAKE 1 TABLET BY MOUTH 30 MINS PRIOR TO BED. IF STILL INSOMNIA TAKE ANOTHER TABLET 30 MIN LATER. 60 tablet 0   No current facility-administered medications on file prior to visit.   Health Maintenance:   Immunization History  Administered Date(s) Administered  . PPD Test 02/02/2015, 03/15/2015  . Tdap 04/24/2013   Tetanus: 2014 Pneumovax: Flu vaccine: TODAY Zostavax: LMP: No LMP recorded. Patient is not currently having periods (Reason: IUD). Pap: 2015, 2018 MGM:N/A  DEXA: Colonoscopy: EGD:  Patient Care Team: Lucky CowboyWilliam McKeown, MD as PCP - General (Internal Medicine) Candice Campavid Lowe, MD as Consulting Physician (Obstetrics and Gynecology)  Allergies: No Known Allergies Medical History:  Past Medical History  Diagnosis Date  . Abnormal Pap smear   . Hx of blood clots 2015    on uterus during pregnancy  . Postpartum depression 04/08/2014  . Obesity (BMI 30-39.9)     BMI 38   Surgical History:   Past Surgical History  Procedure Laterality Date  . Wisdom tooth extraction    . Colposcopy  2014   Family History:  Family History  Problem Relation Age of Onset  . Cancer Maternal Grandmother     breast, late 4440s  . Lupus Paternal Grandfather   . Hearing loss Paternal Grandfather    Social History:  Social History  Substance Use Topics  . Smoking status: Never Smoker   . Smokeless tobacco: None  . Alcohol Use: No  Review of Systems  Constitutional: Positive for weight loss (has been trying). Negative for fever, chills, malaise/fatigue and diaphoresis.  HENT: Negative.   Eyes: Negative.   Respiratory: Negative.  Negative for cough and shortness of breath.   Cardiovascular: Negative.  Negative for palpitations.  Gastrointestinal: Negative.   Genitourinary: Negative.   Musculoskeletal: Positive for joint pain (knee pain). Negative for myalgias, back pain, falls and neck pain.  Skin: Negative.   Neurological: Negative.  Negative for weakness.  Psychiatric/Behavioral: Negative.  Insomnia: better with trazodone.     Physical Exam: Estimated body mass index is 39.51 kg/(m^2) as calculated from the following:   Height as of this encounter: 5\' 3"  (1.6 m).   Weight as of this encounter: 223 lb (101.152 kg). BP 110/66 mmHg  Pulse 111  Temp(Src) 97.5 F (36.4 C) (Temporal)  Resp 16  Ht 5\' 3"  (1.6 m)  Wt 223 lb (101.152 kg)  BMI 39.51 kg/m2  SpO2 97% General Appearance: Well nourished, in no apparent distress.  Eyes: PERRLA, EOMs, conjunctiva no swelling or erythema, normal fundi and vessels.  Sinuses: No Frontal/maxillary tenderness  ENT/Mouth: Ext aud canals clear, normal light reflex with TMs without erythema, bulging. Good dentition. No erythema, swelling, or exudate on post pharynx. Tonsils not swollen or erythematous. Hearing normal.  Neck: Supple, thyroid normal. No bruits  Respiratory: Respiratory effort normal, BS equal bilaterally without rales, rhonchi, wheezing  or stridor.  Cardio: RRR without murmurs, rubs or gallops. Brisk peripheral pulses without edema.  Chest: symmetric, with normal excursions and percussion.  Breasts:defer Abdomen: Soft, nontender, obese, no guarding, rebound, hernias, masses, or organomegaly. .  Lymphatics: right posterior lymph node chain with mobile, nontender 0.5 inch mass versus seb cyst.  Genitourinary: defer Musculoskeletal: Full ROM all peripheral extremities,5/5 strength, and normal gait.  Skin: Warm, dry without rashes, lesions, ecchymosis. Neuro: Cranial nerves intact, reflexes equal bilaterally. Normal muscle tone, no cerebellar symptoms. Sensation intact.  Psych: Awake and oriented X 3, normal affect, Insight and Judgment appropriate.   EKG: defer   Jasmine Espinoza 2:04 PM [facility]

## 2015-08-01 NOTE — Patient Instructions (Signed)
We want weight loss that will last so you should lose 1-2 pounds a week.  THAT IS IT! Please pick THREE things a month to change. Once it is a habit check off the item. Then pick another three items off the list to become habits.  If you are already doing a habit on the list GREAT!  Cross that item off! o Don't drink your calories. Ie, alcohol, soda, fruit juice, and sweet tea.  o Drink more water. Drink a glass when you feel hungry or before each meal.  o Eat breakfast - Complex carb and protein (likeDannon light and fit yogurt, oatmeal, fruit, eggs, turkey bacon). o Measure your cereal.  Eat no more than one cup a day. (ie Kashi) o Eat an apple a day. o Add a vegetable a day. o Try a new vegetable a month. o Use Pam! Stop using oil or butter to cook. o Don't finish your plate or use smaller plates. o Share your dessert. o Eat sugar free Jello for dessert or frozen grapes. o Don't eat 2-3 hours before bed. o Switch to whole wheat bread, pasta, and brown rice. o Make healthier choices when you eat out. No fries! o Pick baked chicken, NOT fried. o Don't forget to SLOW DOWN when you eat. It is not going anywhere.  o Take the stairs. o Park far away in the parking lot o Lift soup cans (or weights) for 10 minutes while watching TV. o Walk at work for 10 minutes during break. o Walk outside 1 time a week with your friend, kids, dog, or significant other. o Start a walking group at church. o Walk the mall as much as you can tolerate.  o Keep a food diary. o Weigh yourself daily. o Walk for 15 minutes 3 days per week. o Cook at home more often and eat out less.  If life happens and you go back to old habits, it is okay.  Just start over. You can do it!   If you experience chest pain, get short of breath, or tired during the exercise, please stop immediately and inform your doctor.   Before you even begin to attack a weight-loss plan, it pays to remember this: You are not fat. You have fat.  Losing weight isn't about blame or shame; it's simply another achievement to accomplish. Dieting is like any other skill-you have to buckle down and work at it. As long as you act in a smart, reasonable way, you'll ultimately get where you want to be. Here are some weight loss pearls for you.  1. It's Not a Diet. It's a Lifestyle Thinking of a diet as something you're on and suffering through only for the short term doesn't work. To shed weight and keep it off, you need to make permanent changes to the way you eat. It's OK to indulge occasionally, of course, but if you cut calories temporarily and then revert to your old way of eating, you'll gain back the weight quicker than you can say yo-yo. Use it to lose it. Research shows that one of the best predictors of long-term weight loss is how many pounds you drop in the first month. For that reason, nutritionists often suggest being stricter for the first two weeks of your new eating strategy to build momentum. Cut out added sugar and alcohol and avoid unrefined carbs. After that, figure out how you can reincorporate them in a way that's healthy and maintainable.  2. There's a Right   Way to Exercise Working out burns calories and fat and boosts your metabolism by building muscle. But those trying to lose weight are notorious for overestimating the number of calories they burn and underestimating the amount they take in. Unfortunately, your system is biologically programmed to hold on to extra pounds and that means when you start exercising, your body senses the deficit and ramps up its hunger signals. If you're not diligent, you'll eat everything you burn and then some. Use it to lose it. Cardio gets all the exercise glory, but strength and interval training are the real heroes. They help you build lean muscle, which in turn increases your metabolism and calorie-burning ability 3. Don't Overreact to Mild Hunger Some people have a hard time losing weight because  of hunger anxiety. To them, being hungry is bad-something to be avoided at all costs-so they carry snacks with them and eat when they don't need to. Others eat because they're stressed out or bored. While you never want to get to the point of being ravenous (that's when bingeing is likely to happen), a hunger pang, a craving, or the fact that it's 3:00 p.m. should not send you racing for the vending machine or obsessing about the energy bar in your purse. Ideally, you should put off eating until your stomach is growling and it's difficult to concentrate.  Use it to lose it. When you feel the urge to eat, use the HALT method. Ask yourself, Am I really hungry? Or am I angry or anxious, lonely or bored, or tired? If you're still not certain, try the apple test. If you're truly hungry, an apple should seem delicious; if it doesn't, something else is going on. Or you can try drinking water and making yourself busy, if you are still hungry try a healthy snack.  4. Not All Calories Are Created Equal The mechanics of weight loss are pretty simple: Take in fewer calories than you use for energy. But the kind of food you eat makes all the difference. Processed food that's high in saturated fat and refined starch or sugar can cause inflammation that disrupts the hormone signals that tell your brain you're full. The result: You eat a lot more.  Use it to lose it. Clean up your diet. Swap in whole, unprocessed foods, including vegetables, lean protein, and healthy fats that will fill you up and give you the biggest nutritional bang for your calorie buck. In a few weeks, as your brain starts receiving regular hunger and fullness signals once again, you'll notice that you feel less hungry overall and naturally start cutting back on the amount you eat.  5. Protein, Produce, and Plant-Based Fats Are Your Weight-Loss Trinity Here's why eating the three Ps regularly will help you drop pounds. Protein fills you up. You need it  to build lean muscle, which keeps your metabolism humming so that you can torch more fat. People in a weight-loss program who ate double the recommended daily allowance for protein (about 110 grams for a 150-pound woman) lost 70 percent of their weight from fat, while people who ate the RDA lost only about 40 percent, one study found. Produce is packed with filling fiber. "It's very difficult to consume too many calories if you're eating a lot of vegetables. Example: Three cups of broccoli is a lot of food, yet only 93 calories. (Fruit is another story. It can be easy to overeat and can contain a lot of calories from sugar, so be sure to   monitor your intake.) Plant-based fats like olive oil and those in avocados and nuts are healthy and extra satiating.  Use it to lose it. Aim to incorporate each of the three Ps into every meal and snack. People who eat protein throughout the day are able to keep weight off, according to a study in the American Journal of Clinical Nutrition. In addition to meat, poultry and seafood, good sources are beans, lentils, eggs, tofu, and yogurt. As for fat, keep portion sizes in check by measuring out salad dressing, oil, and nut butters (shoot for one to two tablespoons). Finally, eat veggies or a little fruit at every meal. People who did that consumed 308 fewer calories but didn't feel any hungrier than when they didn't eat more produce.  7. How You Eat Is As Important As What You Eat In order for your brain to register that you're full, you need to focus on what you're eating. Sit down whenever you eat, preferably at a table. Turn off the TV or computer, put down your phone, and look at your food. Smell it. Chew slowly, and don't put another bite on your fork until you swallow. When women ate lunch this attentively, they consumed 30 percent less when snacking later than those who listened to an audiobook at lunchtime, according to a study in the British Journal of Nutrition. 8.  Weighing Yourself Really Works The scale provides the best evidence about whether your efforts are paying off. Seeing the numbers tick up or down or stagnate is motivation to keep going-or to rethink your approach. A 2015 study at [employer] found that daily weigh-ins helped people lose more weight, keep it off, and maintain that loss, even after two years. Use it to lose it. Step on the scale at the same time every day for the best results. If your weight shoots up several pounds from one weigh-in to the next, don't freak out. Eating a lot of salt the night before or having your period is the likely culprit. The number should return to normal in a day or two. It's a steady climb that you need to do something about. 9. Too Much Stress and Too Little Sleep Are Your Enemies When you're tired and frazzled, your body cranks up the production of cortisol, the stress hormone that can cause carb cravings. Not getting enough sleep also boosts your levels of ghrelin, a hormone associated with hunger, while suppressing leptin, a hormone that signals fullness and satiety. People on a diet who slept only five and a half hours a night for two weeks lost 55 percent less fat and were hungrier than those who slept eight and a half hours, according to a study in the [employer] Medical Association Journal. Use it to lose it. Prioritize sleep, aiming for seven hours or more a night, which research shows helps lower stress. And make sure you're getting quality zzz's. If a snoring spouse or a fidgety cat wakes you up frequently throughout the night, you may end up getting the equivalent of just four hours of sleep, according to a study from [employer]. Keep pets out of the bedroom, and use a white-noise app to drown out snoring. 10. You Will Hit a plateau-And You Can Bust Through It As you slim down, your body releases much less leptin, the fullness hormone.  If you're not strength training, start right now.  Building muscle can raise your metabolism to help you overcome a plateau. To keep your body challenged   and burning calories, incorporate new moves and more intense intervals into your workouts or add another sweat session to your weekly routine. Alternatively, cut an extra 100 calories or so a day from your diet. Now that you've lost weight, your body simply doesn't need as much fuel.   Ways to cut 100 calories  1. Eat your eggs with hot sauce OR salsa instead of cheese.  Eggs are great for breakfast, but many people consider eggs and cheese to be BFFs. Instead of cheese-1 oz. of cheddar has 114 calories-top your eggs with hot sauce, which contains no calories and helps with satiety and metabolism. Salsa is also a great option!!  2. Top your toast, waffles or pancakes with mashed berries instead of jelly or syrup. Half a cup of berries-fresh, frozen or thawed-has about 40 calories, compared with 2 tbsp. of maple syrup or jelly, which both have about 100 calories. The berries will also give you a good punch of fiber, which helps keep you full and satisfied and won't spike blood sugar quickly like the jelly or syrup. 3. Swap the non-fat latte for black coffee with a splash of half-and-half. Contrary to its name, that non-fat latte has 130 calories and a startling 19g of carbohydrates per 16 oz. serving. Replacing that 'light' drinkable dessert with a black coffee with a splash of half-and-half saves you more than 100 calories per 16 oz. serving. 4. Sprinkle salads with freeze-dried raspberries instead of dried cranberries. If you want a sweet addition to your nutritious salad, stay away from dried cranberries. They have a whopping 130 calories per  cup and 30g carbohydrates. Instead, sprinkle freeze-dried raspberries guilt-free and save more than 100 calories per  cup serving, adding 3g of belly-filling fiber. 5. Go for mustard in place of mayo on your sandwich. Mustard can add really nice flavor to any  sandwich, and there are tons of varieties, from spicy to honey. A serving of mayo is 95 calories, versus 10 calories in a serving of mustard. 6. Choose a DIY salad dressing instead of the store-bought kind. Mix Dijon or whole grain mustard with low-fat Kefir or red wine vinegar and garlic. 7. Use hummus as a spread instead of a dip. Use hummus as a spread on a high-fiber cracker or tortilla with a sandwich and save on calories without sacrificing taste. 8. Pick just one salad "accessory." Salad isn't automatically a calorie winner. It's easy to over-accessorize with toppings. Instead of topping your salad with nuts, avocado and cranberries (all three will clock in at 313 calories), just pick one. The next day, choose a different accessory, which will also keep your salad interesting. You don't wear all your jewelry every day, right? 9. Ditch the white pasta in favor of spaghetti squash. One cup of cooked spaghetti squash has about 40 calories, compared with traditional spaghetti, which comes with more than 200. Spaghetti squash is also nutrient-dense. It's a good source of fiber and Vitamins A and C, and it can be eaten just like you would eat pasta-with a great tomato sauce and turkey meatballs or with pesto, tofu and spinach, for example. 10. Dress up your chili, soups and stews with non-fat Greek yogurt instead of sour cream. Just a 'dollop' of sour cream can set you back 115 calories and a whopping 12g of fat-seven of which are of the artery-clogging variety. Added bonus: Greek yogurt is packed with muscle-building protein, calcium and B Vitamins. 11. Mash cauliflower instead of mashed potatoes. One cup of   traditional mashed potatoes-in all their creamy goodness-has more than 200 calories, compared to mashed cauliflower, which you can typically eat for less than 100 calories per 1 cup serving. Cauliflower is a great source of the antioxidant indole-3-carbinol (I3C), which may help reduce the risk of  some cancers, like breast cancer. 12. Ditch the ice cream sundae in favor of a Greek yogurt parfait. Instead of a cup of ice cream or fro-yo for dessert, try 1 cup of nonfat Greek yogurt topped with fresh berries and a sprinkle of cacao nibs. Both toppings are packed with antioxidants, which can help reduce cellular inflammation and oxidative damage. And the comparison is a no-brainer: One cup of ice cream has about 275 calories; one cup of frozen yogurt has about 230; and a cup of Greek yogurt has just 130, plus twice the protein, so you're less likely to return to the freezer for a second helping. 13. Put olive oil in a spray container instead of using it directly from the bottle. Each tablespoon of olive oil is 120 calories and 15g of fat. Use a mister instead of pouring it straight into the pan or onto a salad. This allows for portion control and will save you more than 100 calories. 14. When baking, substitute canned pumpkin for butter or oil. Canned pumpkin-not pumpkin pie mix-is loaded with Vitamin A, which is important for skin and eye health, as well as immunity. And the comparisons are pretty crazy:  cup of canned pumpkin has about 40 calories, compared to butter or oil, which has more than 800 calories. Yes, 800 calories. Applesauce and mashed banana can also serve as good substitutions for butter or oil, usually in a 1:1 ratio. 15. Top casseroles with high-fiber cereal instead of breadcrumbs. Breadcrumbs are typically made with white bread, while breakfast cereals contain 5-9g of fiber per serving. Not only will you save more than 150 calories per  cup serving, the swap will also keep you more full and you'll get a metabolism boost from the added fiber. 16. Snack on pistachios instead of macadamia nuts. Believe it or not, you get the same amount of calories from 35 pistachios (100 calories) as you would from only five macadamia nuts. 17. Chow down on kale chips rather than potato  chips. This is my favorite 'don't knock it 'till you try it' swap. Kale chips are so easy to make at home, and you can spice them up with a little grated parmesan or chili powder. Plus, they're a mere fraction of the calories of potato chips, but with the same crunch factor we crave so often. 18. Add seltzer and some fruit slices to your cocktail instead of soda or fruit juice. One cup of soda or fruit juice can pack on as much as 140 calories. Instead, use seltzer and fruit slices. The fruit provides valuable phytochemicals, such as flavonoids and anthocyanins, which help to combat cancer and stave off the aging process.  

## 2015-08-02 LAB — URINALYSIS, MICROSCOPIC ONLY
Bacteria, UA: NONE SEEN [HPF]
Casts: NONE SEEN [LPF]
Crystals: NONE SEEN [HPF]
RBC / HPF: NONE SEEN RBC/HPF (ref <=2)
Yeast: NONE SEEN [HPF]

## 2015-08-02 LAB — LIPID PANEL
Cholesterol: 118 mg/dL — ABNORMAL LOW (ref 125–200)
HDL: 26 mg/dL — AB (ref >=46)
LDL Cholesterol: 64 mg/dL (ref <130)
Total CHOL/HDL Ratio: 4.5 Ratio (ref <=5.0)
Triglycerides: 139 mg/dL (ref <150)
VLDL: 28 mg/dL (ref <30)

## 2015-08-02 LAB — HEPATIC FUNCTION PANEL
ALBUMIN: 4.6 g/dL (ref 3.6–5.1)
ALT: 18 U/L (ref 6–29)
AST: 17 U/L (ref 10–30)
Alkaline Phosphatase: 90 U/L (ref 33–115)
Bilirubin, Direct: 0.1 mg/dL (ref <=0.2)
Indirect Bilirubin: 0.2 mg/dL (ref 0.2–1.2)
Total Bilirubin: 0.3 mg/dL (ref 0.2–1.2)
Total Protein: 8.1 g/dL (ref 6.1–8.1)

## 2015-08-02 LAB — BASIC METABOLIC PANEL WITH GFR
BUN: 9 mg/dL (ref 7–25)
CHLORIDE: 101 mmol/L (ref 98–110)
CO2: 25 mmol/L (ref 20–31)
CREATININE: 0.7 mg/dL (ref 0.50–1.10)
Calcium: 9.9 mg/dL (ref 8.6–10.2)
GFR, Est African American: >89 mL/min (ref >=60)
GFR, Est Non African American: >89 mL/min (ref >=60)
Glucose, Bld: 74 mg/dL (ref 65–99)
POTASSIUM: 4.5 mmol/L (ref 3.5–5.3)
Sodium: 139 mmol/L (ref 135–146)

## 2015-08-02 LAB — TSH: TSH: 3.483 u[IU]/mL (ref 0.350–4.500)

## 2015-08-02 LAB — URINALYSIS, ROUTINE W REFLEX MICROSCOPIC
Bilirubin Urine: NEGATIVE
Glucose, UA: NEGATIVE
HGB URINE DIPSTICK: NEGATIVE
Ketones, ur: NEGATIVE
NITRITE: NEGATIVE
PH: 5 (ref 5.0–8.0)
Protein, ur: NEGATIVE
Specific Gravity, Urine: 1.023 (ref 1.001–1.035)

## 2015-08-02 LAB — MICROALBUMIN / CREATININE URINE RATIO
Creatinine, Urine: 180 mg/dL (ref 20–320)
MICROALB UR: 0.8 mg/dL
Microalb Creat Ratio: 4 mcg/mg creat (ref <30)

## 2015-08-02 LAB — HEMOGLOBIN A1C
HEMOGLOBIN A1C: 5.6 % (ref <5.7)
MEAN PLASMA GLUCOSE: 114 mg/dL (ref <117)

## 2015-08-02 LAB — MAGNESIUM: Magnesium: 2 mg/dL (ref 1.5–2.5)

## 2015-08-02 LAB — INSULIN, FASTING: Insulin fasting, serum: 23.4 u[IU]/mL — ABNORMAL HIGH (ref 2.0–19.6)

## 2015-08-02 LAB — VITAMIN D 25 HYDROXY (VIT D DEFICIENCY, FRACTURES): VIT D 25 HYDROXY: 31 ng/mL (ref 30–100)

## 2015-08-03 ENCOUNTER — Other Ambulatory Visit: Payer: Self-pay

## 2015-08-05 ENCOUNTER — Ambulatory Visit
Admission: RE | Admit: 2015-08-05 | Discharge: 2015-08-05 | Disposition: A | Discharge status: Home / Self Care | Payer: 59 | Source: Ambulatory Visit | Attending: Physician Assistant | Admitting: Physician Assistant

## 2015-08-05 DIAGNOSIS — R590 Localized enlarged lymph nodes: Secondary | ICD-10-CM

## 2016-01-25 ENCOUNTER — Encounter: Payer: Self-pay | Admitting: Physician Assistant

## 2016-01-25 ENCOUNTER — Ambulatory Visit (INDEPENDENT_AMBULATORY_CARE_PROVIDER_SITE_OTHER): Payer: 59 | Admitting: Physician Assistant

## 2016-01-25 VITALS — BP 110/68 | HR 87 | Temp 97.9°F | Resp 16 | Ht 63.0 in | Wt 210.0 lb

## 2016-01-25 DIAGNOSIS — F53 Postpartum depression: Secondary | ICD-10-CM

## 2016-01-25 DIAGNOSIS — E669 Obesity, unspecified: Secondary | ICD-10-CM | POA: Diagnosis not present

## 2016-01-25 DIAGNOSIS — O99345 Other mental disorders complicating the puerperium: Secondary | ICD-10-CM

## 2016-01-25 NOTE — Progress Notes (Signed)
Assessment and Plan:  1. Hypertension -Continue medication, monitor blood pressure at home. Continue DASH diet.  Reminder to go to the ER if any CP, SOB, nausea, dizziness, severe HA, changes vision/speech, left arm numbness and tingling and jaw pain.  2. Cholesterol -Continue diet and exercise. Check cholesterol.   3. Prediabetes  -Continue diet and exercise. Check A1C  4. Vitamin D Def - check level and continue medications.   Continue diet and meds as discussed. Further disposition pending results of labs. Over 30 minutes of exam, counseling, chart review, and critical decision making was performed  HPI 30 y.o. female  presents for 3 month follow up on hypertension, cholesterol, prediabetes, and vitamin D deficiency.  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   Her blood pressure has been controlled at home, today their BP is BP: 110/68 mmHg  She does workout, walking once a week. She denies chest pain, shortness of breath, dizziness. She is off trazodone and lexapro and doing well.   She is not on cholesterol medication and denies myalgias. Her cholesterol is at goal. The cholesterol last visit was:   Lab Results  Component Value Date   CHOL 118* 08/01/2015   HDL 26* 08/01/2015   LDLCALC 64 08/01/2015   TRIG 139 08/01/2015   CHOLHDL 4.5 08/01/2015    She has been working on diet and exercise for prediabetes, and denies paresthesia of the feet, polydipsia, polyuria and visual disturbances. Last A1C in the office was:  Lab Results  Component Value Date   HGBA1C 5.6 08/01/2015   Patient is on Vitamin D supplement.   Lab Results  Component Value Date   VD25OH 31 08/01/2015     Immunization History  Administered Date(s) Administered  . Influenza Split 08/01/2015  . PPD Test 02/02/2015, 03/15/2015  . Tdap 04/24/2013   BMI is Body mass index is 37.21 kg/(m^2)., she is working on diet and exercise. Wt Readings from Last 3 Encounters:   01/25/16 210 lb (95.255 kg)  08/01/15 223 lb (101.152 kg)  03/24/15 233 lb (105.688 kg)    Current Medications:  Current Outpatient Prescriptions on File Prior to Visit  Medication Sig Dispense Refill  . levonorgestrel (MIRENA) 20 MCG/24HR IUD 1 each by Intrauterine route once.    . metroNIDAZOLE (METROCREAM) 0.75 % cream Apply topically at bedtime. 60 g 1  . traZODone (DESYREL) 50 MG tablet TAKE 1 TABLET BY MOUTH 30 MINS PRIOR TO BED. IF STILL INSOMNIA TAKE ANOTHER TABLET 30 MIN LATER. 60 tablet 0   No current facility-administered medications on file prior to visit.   Medical History:  Past Medical History  Diagnosis Date  . Abnormal Pap smear   . Hx of blood clots 2015    on uterus during pregnancy  . Postpartum depression 04/08/2014  . Obesity (BMI 30-39.9)     BMI 38   Allergies: No Known Allergies   Review of Systems:  Review of Systems  Constitutional: Negative.   HENT: Negative.   Eyes: Negative.   Respiratory: Negative.   Cardiovascular: Negative.   Gastrointestinal: Negative.   Genitourinary: Negative.   Musculoskeletal: Negative.   Skin: Negative.   Neurological: Negative.   Endo/Heme/Allergies: Negative.   Psychiatric/Behavioral: Negative.     Family history- Review and unchanged Social history- Review and unchanged Physical Exam: BP 110/68 mmHg  Pulse 87  Temp(Src) 97.9 F (36.6 C) (Temporal)  Resp 16  Ht 5\' 3"  (1.6 m)  Wt 210  lb (95.255 kg)  BMI 37.21 kg/m2  SpO2 98% Wt Readings from Last 3 Encounters:  01/25/16 210 lb (95.255 kg)  08/01/15 223 lb (101.152 kg)  03/24/15 233 lb (105.688 kg)   General Appearance: Well nourished, in no apparent distress. Eyes: PERRLA, EOMs, conjunctiva no swelling or erythema Sinuses: No Frontal/maxillary tenderness ENT/Mouth: Ext aud canals clear, TMs without erythema, bulging. No erythema, swelling, or exudate on post pharynx.  Tonsils not swollen or erythematous. Hearing normal.  Neck: Supple, thyroid  normal.  Respiratory: Respiratory effort normal, BS equal bilaterally without rales, rhonchi, wheezing or stridor.  Cardio: RRR with no MRGs. Brisk peripheral pulses without edema.  Abdomen: Soft, + BS,  Non tender, no guarding, rebound, hernias, masses. Lymphatics: Non tender without lymphadenopathy.  Musculoskeletal: Full ROM, 5/5 strength, Normal gait Skin: Warm, dry without rashes, lesions, ecchymosis.  Neuro: Cranial nerves intact. Normal muscle tone, no cerebellar symptoms. Psych: Awake and oriented X 3, normal affect, Insight and Judgment appropriate.    Quentin Mulling, PA-C 11:39 AM [facility]

## 2016-01-30 ENCOUNTER — Ambulatory Visit: Payer: Self-pay | Admitting: Physician Assistant

## 2016-04-19 ENCOUNTER — Ambulatory Visit (INDEPENDENT_AMBULATORY_CARE_PROVIDER_SITE_OTHER): Payer: 59

## 2016-04-19 DIAGNOSIS — Z111 Encounter for screening for respiratory tuberculosis: Secondary | ICD-10-CM | POA: Diagnosis not present

## 2016-04-23 LAB — TB SKIN TEST
Induration: 0 mm
TB SKIN TEST: NEGATIVE

## 2016-06-21 ENCOUNTER — Ambulatory Visit (INDEPENDENT_AMBULATORY_CARE_PROVIDER_SITE_OTHER): Payer: 59 | Admitting: Internal Medicine

## 2016-06-21 ENCOUNTER — Encounter: Payer: Self-pay | Admitting: Internal Medicine

## 2016-06-21 VITALS — BP 122/74 | HR 86 | Temp 98.2°F | Resp 16 | Ht 63.0 in | Wt 212.0 lb

## 2016-06-21 DIAGNOSIS — J069 Acute upper respiratory infection, unspecified: Secondary | ICD-10-CM | POA: Diagnosis not present

## 2016-06-21 MED ORDER — PREDNISONE 20 MG PO TABS: ORAL_TABLET | ORAL | 0 refills | Status: DC

## 2016-06-21 MED ORDER — AZITHROMYCIN 250 MG PO TABS: ORAL_TABLET | ORAL | 0 refills | Status: DC

## 2016-06-21 MED ORDER — FLUTICASONE PROPIONATE 50 MCG/ACT NA SUSP: 2.0000 | Freq: Every day | NASAL | 0 refills | Status: AC

## 2016-06-21 NOTE — Progress Notes (Signed)
Patient ID: Sonia BallerLauren M Plocher, female   DOB: 08/22/1986, 30 y.o.   MRN: [id number] HPI  Patient presents to the office for evaluation of cough.  It has been going on for 10 days.  Patient reports night > day, dry, barky, worse with lying down.  They also endorse postnasal drip, wheezing and sore throat, left ear pain, nasal congestion, sinus pressure..  They have tried tylenol, ibuprofen, and benadryl.  They report that nothing has worked.  They denies other sick contacts.  Review of Systems  Constitutional: Positive for malaise/fatigue. Negative for chills and fever.  HENT: Positive for congestion, ear pain, hearing loss and sore throat.   Respiratory: Positive for cough. Negative for sputum production, shortness of breath and wheezing.   Cardiovascular: Negative for chest pain, palpitations and leg swelling.  Neurological: Positive for headaches.    PE:  Vitals:   06/21/16 1507  BP: 122/74  Pulse: 86  Resp: 16  Temp: 98.2 F (36.8 C)    General:  Alert and non-toxic, WDWN, NAD HEENT: NCAT, PERLA, EOM normal, no occular discharge or erythema.  Nasal mucosal edema with sinus tenderness to palpation.  Oropharynx clear with minimal oropharyngeal edema and erythema.  Mucous membranes moist and pink. Neck:  Cervical adenopathy Chest:  RRR no MRGs.  Lungs clear to auscultation A&P with no wheezes rhonchi or rales.   Abdomen: +BS x 4 quadrants, soft, non-tender, no guarding, rigidity, or rebound. Skin: warm and dry no rash Neuro: A&Ox4, CN II-XII grossly intact  Assessment and Plan:   1. Acute URI -nasal saline -daily antihistamine - predniSONE (DELTASONE) 20 MG tablet; 3 tabs po daily x 3 days, then 2 tabs x 3 days, then 1.5 tabs x 3 days, then 1 tab x 3 days, then 0.5 tabs x 3 days  Dispense: 27 tablet; Refill: 0 - azithromycin (ZITHROMAX Z-PAK) 250 MG tablet; 2 po day one, then 1 daily x 4 days  Dispense: 6 tablet; Refill: 0 - fluticasone (FLONASE) 50 MCG/ACT nasal spray; Place 2  sprays into both nostrils daily.  Dispense: 16 g; Refill: 0

## 2016-06-23 ENCOUNTER — Emergency Department (HOSPITAL_BASED_OUTPATIENT_CLINIC_OR_DEPARTMENT_OTHER)
Admission: EM | Admit: 2016-06-23 | Discharge: 2016-06-23 | Disposition: A | Discharge status: Home / Self Care | Payer: 59 | Attending: Emergency Medicine | Admitting: Emergency Medicine

## 2016-06-23 ENCOUNTER — Emergency Department (HOSPITAL_BASED_OUTPATIENT_CLINIC_OR_DEPARTMENT_OTHER): Payer: 59

## 2016-06-23 ENCOUNTER — Other Ambulatory Visit: Payer: Self-pay | Admitting: Internal Medicine

## 2016-06-23 ENCOUNTER — Encounter (HOSPITAL_BASED_OUTPATIENT_CLINIC_OR_DEPARTMENT_OTHER): Payer: Self-pay | Admitting: Emergency Medicine

## 2016-06-23 DIAGNOSIS — B9789 Other viral agents as the cause of diseases classified elsewhere: Secondary | ICD-10-CM

## 2016-06-23 DIAGNOSIS — J988 Other specified respiratory disorders: Secondary | ICD-10-CM | POA: Diagnosis not present

## 2016-06-23 DIAGNOSIS — Z79899 Other long term (current) drug therapy: Secondary | ICD-10-CM | POA: Diagnosis not present

## 2016-06-23 DIAGNOSIS — J029 Acute pharyngitis, unspecified: Secondary | ICD-10-CM | POA: Diagnosis present

## 2016-06-23 LAB — PREGNANCY, URINE: PREG TEST UR: NEGATIVE

## 2016-06-23 NOTE — Discharge Instructions (Signed)
Take Tylenol as directed for pain. Dissolve 1/4 teaspoon salt in 8 ounces of warm water and gargle every 4 hours while awake. Call Dr. Kathryne SharperMcKeown's office to arrange to be seen if not feeling better by next week. Return if your condition worsens or if concerned for any reason

## 2016-06-23 NOTE — ED Provider Notes (Signed)
[facility] Provider Note   CSN: [id number] Arrival date & time: 06/23/16  1758 By signing my name below, I, Jasmine Espinoza, attest that this documentation has been prepared under the direction and in the presence of Doug Sou, MD . Electronically Signed: Levon Espinoza, Scribe. 06/23/2016. 6:54 PM.   History   Chief Complaint Chief Complaint  Patient presents with  . Shortness of Breath    HPI Jasmine Espinoza is a 30 y.o. female who presents to the Emergency Department complaining of constant sore throat which began about 1.5 weeks ago. She describes the pain as 6/10 and states it is worse on the right side. Pt has taken tylenol with no relief. Pt notes associated Mild chest pain with inspiration and intermittent cough. Pt states her cough is mostly dry, but she does endorse occasional yellow sputum production. She denies shortness of breath She was seen by her PCP two days ago and has started taking Z-pack and 3 day prednisone taper. Pt has been complaint with her medication and states that she saw some initial relief, but now feels as sore throat is worse. She continues to have slight cough. Maximum temperature 99 She denies any tobacco, alcohol, or drug use. Pt denies any fever or other associated symptoms. LNMP was 06/07/16.   The history is provided by the patient. No language interpreter was used.    Past Medical History:  Diagnosis Date  . Abnormal Pap smear   . Hx of blood clots 2015   on uterus during pregnancy  . Obesity (BMI 30-39.9)    BMI 38  . Postpartum depression 04/08/2014    Patient Active Problem List   Diagnosis Date Noted  . Obesity 07/27/2014  . Postpartum depression 04/08/2014    Past Surgical History:  Procedure Laterality Date  . COLPOSCOPY  2014  . WISDOM TOOTH EXTRACTION      OB History    Gravida Para Term Preterm AB Living   2 2 1 1   2    SAB TAB Ectopic Multiple Live Births           2       Home Medications     Prior to Admission medications   Medication Sig Start Date End Date Taking? Authorizing Provider  cetirizine (ZYRTEC) 5 MG tablet Take 5 mg by mouth daily.   Yes Historical Provider, MD  azithromycin (ZITHROMAX Z-PAK) 250 MG tablet 2 po day one, then 1 daily x 4 days 06/21/16   Toni Amend Forcucci, PA-C  fluticasone (FLONASE) 50 MCG/ACT nasal spray Place 2 sprays into both nostrils daily. 06/21/16   Courtney Forcucci, PA-C  predniSONE (DELTASONE) 20 MG tablet 3 tabs po daily x 3 days, then 2 tabs x 3 days, then 1.5 tabs x 3 days, then 1 tab x 3 days, then 0.5 tabs x 3 days 06/21/16   Terri Piedra, PA-C    Family History Family History  Problem Relation Age of Onset  . Cancer Maternal Grandmother     breast, late 45s  . Lupus Paternal Grandfather   . Hearing loss Paternal Grandfather     Social History Social History  Substance Use Topics  . Smoking status: Never Smoker  . Smokeless tobacco: Never Used  . Alcohol use No     Allergies   Review of patient's allergies indicates no known allergies.   Review of Systems Review of Systems  Constitutional: Negative.   HENT: Positive for sore throat and voice change.  Slight hoarseness  Respiratory: Positive for cough. Negative for shortness of breath.   Cardiovascular: Negative.   Gastrointestinal: Negative.   Musculoskeletal: Negative.   Skin: Negative.   Neurological: Negative.   Psychiatric/Behavioral: Negative.   All other systems reviewed and are negative.    Physical Exam Updated Vital Signs BP 130/93 (BP Location: Right Arm)   Pulse 84   Temp 98.6 F (37 C) (Oral)   Resp 18   Ht 5\' 3"  (1.6 m)   Wt 215 lb (97.5 kg)   LMP 06/06/2016   SpO2 96%   BMI 38.09 kg/m   Physical Exam  Constitutional: She appears well-developed and well-nourished. No distress.  HENT:  Head: Normocephalic and atraumatic.  Mouth/Throat: Oropharynx is clear and moist.  Bilateral tympanic membranes normal  Eyes: Conjunctivae  are normal. Pupils are equal, round, and reactive to light.  Neck: Neck supple. No tracheal deviation present. No thyromegaly present.  Tender anterior cervical nodes  Cardiovascular: Normal rate and regular rhythm.   No murmur heard. Pulmonary/Chest: Effort normal and breath sounds normal.  Abdominal: Soft. Bowel sounds are normal. She exhibits no distension. There is no tenderness.  Obese  Musculoskeletal: Normal range of motion. She exhibits no edema or tenderness.  Lymphadenopathy:    She has cervical adenopathy.  Neurological: She is alert. Coordination normal.  Skin: Skin is warm and dry. No rash noted.  Psychiatric: She has a normal mood and affect.  Nursing note and vitals reviewed.    ED Treatments / Results  DIAGNOSTIC STUDIES:  Oxygen Saturation is 96% on RA, adequate by my interpretation.    COORDINATION OF CARE:  6:49 PM Discussed treatment plan with pt at bedside and pt agreed to plan.   Labs (all labs ordered are listed, but only abnormal results are displayed) Labs Reviewed  PREGNANCY, URINE    EKG  EKG Interpretation None       Radiology No results found.  Procedures Procedures (including critical care time)  Medications Ordered in ED Medications - No data to display   Initial Impression / Assessment and Plan / ED Course  I have reviewed the triage vital signs and the nursing notes.  Pertinent labs & imaging results that were available during my care of the patient were reviewed by me and considered in my medical decision making (see chart for details).  Clinical Course    Chest x-ray not indicated. Discussed with patient who agrees. She has clear lungs on exam no wrist or distress. normal respiratory rate normal pulse ox. Symptoms likely viral in etiology. I offered CT scan of soft tissues neck the patient which she declined. I don't feel the patient has neck abscess Epiglottitis. She appears in no distress isn't and is nontoxic. Suggest  home observation. Saltwater gargles. Follow-up with PMD if not better in 2 days.  Final Clinical Impressions(s) / ED Diagnoses  Diagnosis viral respiratory illness Final diagnoses:  None    I personally performed the services described in this documentation, which was scribed in my presence. The recorded information has been reviewed and considered.  New Prescriptions New Prescriptions   No medications on file     Doug SouSam Shahan Starks, MD 06/23/16 1906

## 2016-06-23 NOTE — ED Notes (Signed)
States she started abx and steroids Thursday and was told to get rechecked if not feeling any better by day 3. Pt c/o non productive cough and malaise.

## 2016-06-23 NOTE — ED Triage Notes (Signed)
Pt saw her PMD on thurs for URI and sent home with zpack and prednisone.  Pt reports some relief initially but that symptoms have since worsened and pt c/o chest pain with coughing and deep inspiration.  Lung sounds diminished in triage.  Pt took tylenol OTC at approx 1600 today.

## 2016-07-09 ENCOUNTER — Encounter: Payer: Self-pay | Admitting: Internal Medicine

## 2016-07-10 ENCOUNTER — Other Ambulatory Visit: Payer: Self-pay | Admitting: Internal Medicine

## 2016-07-10 MED ORDER — AZELASTINE HCL 0.1 % NA SOLN: 2.0000 | Freq: Two times a day (BID) | NASAL | 2 refills | Status: DC

## 2016-08-01 ENCOUNTER — Encounter: Payer: Self-pay | Admitting: Physician Assistant

## 2016-08-01 ENCOUNTER — Ambulatory Visit (INDEPENDENT_AMBULATORY_CARE_PROVIDER_SITE_OTHER): Payer: 59 | Admitting: Physician Assistant

## 2016-08-01 DIAGNOSIS — D649 Anemia, unspecified: Secondary | ICD-10-CM

## 2016-08-01 DIAGNOSIS — Z79899 Other long term (current) drug therapy: Secondary | ICD-10-CM

## 2016-08-01 DIAGNOSIS — Z0000 Encounter for general adult medical examination without abnormal findings: Secondary | ICD-10-CM | POA: Diagnosis not present

## 2016-08-01 DIAGNOSIS — R7309 Other abnormal glucose: Secondary | ICD-10-CM

## 2016-08-01 DIAGNOSIS — Z1389 Encounter for screening for other disorder: Secondary | ICD-10-CM

## 2016-08-01 DIAGNOSIS — E559 Vitamin D deficiency, unspecified: Secondary | ICD-10-CM

## 2016-08-01 LAB — CBC WITH DIFFERENTIAL/PLATELET
BASOS PCT: 0 %
Basophils Absolute: 0 cells/uL (ref 0–200)
EOS ABS: 112 {cells}/uL (ref 15–500)
Eosinophils Relative: 2 %
HCT: 44.3 % (ref 35.0–45.0)
Hemoglobin: 14.5 g/dL (ref 11.7–15.5)
LYMPHS PCT: 28 %
Lymphs Abs: 1568 cells/uL (ref 850–3900)
MCH: 28.2 pg (ref 27.0–33.0)
MCHC: 32.7 g/dL (ref 32.0–36.0)
MCV: 86 fL (ref 80.0–100.0)
MONOS PCT: 9 %
MPV: 9.7 fL (ref 7.5–12.5)
Monocytes Absolute: 504 cells/uL (ref 200–950)
NEUTROS PCT: 61 %
Neutro Abs: 3416 cells/uL (ref 1500–7800)
PLATELETS: 275 10*3/uL (ref 140–400)
RBC: 5.15 MIL/uL — AB (ref 3.80–5.10)
RDW: 14 % (ref 11.0–15.0)
WBC: 5.6 10*3/uL (ref 3.8–10.8)

## 2016-08-01 LAB — IRON AND TIBC
%SAT: 30 % (ref 11–50)
Iron: 102 ug/dL (ref 40–190)
TIBC: 340 ug/dL (ref 250–450)
UIBC: 238 ug/dL (ref 125–400)

## 2016-08-01 LAB — LIPID PANEL
CHOLESTEROL: 141 mg/dL (ref 125–200)
HDL: 34 mg/dL — AB (ref >=46)
LDL Cholesterol: 74 mg/dL (ref <130)
TRIGLYCERIDES: 164 mg/dL — AB (ref <150)
Total CHOL/HDL Ratio: 4.1 Ratio (ref <=5.0)
VLDL: 33 mg/dL — ABNORMAL HIGH (ref <30)

## 2016-08-01 LAB — BASIC METABOLIC PANEL WITH GFR
BUN: 7 mg/dL (ref 7–25)
CALCIUM: 9.2 mg/dL (ref 8.6–10.2)
CO2: 24 mmol/L (ref 20–31)
Chloride: 105 mmol/L (ref 98–110)
Creat: 0.82 mg/dL (ref 0.50–1.10)
GFR, Est Non African American: >89 mL/min (ref >=60)
GLUCOSE: 109 mg/dL — AB (ref 65–99)
POTASSIUM: 4.4 mmol/L (ref 3.5–5.3)
Sodium: 138 mmol/L (ref 135–146)

## 2016-08-01 LAB — HEPATIC FUNCTION PANEL
ALBUMIN: 3.9 g/dL (ref 3.6–5.1)
ALK PHOS: 91 U/L (ref 33–115)
ALT: 16 U/L (ref 6–29)
AST: 16 U/L (ref 10–30)
BILIRUBIN TOTAL: 0.4 mg/dL (ref 0.2–1.2)
Bilirubin, Direct: 0.1 mg/dL (ref <=0.2)
Indirect Bilirubin: 0.3 mg/dL (ref 0.2–1.2)
TOTAL PROTEIN: 7.2 g/dL (ref 6.1–8.1)

## 2016-08-01 LAB — VITAMIN B12: Vitamin B-12: 398 pg/mL (ref 200–1100)

## 2016-08-01 LAB — FERRITIN: FERRITIN: 92 ng/mL (ref 10–154)

## 2016-08-01 LAB — MAGNESIUM: MAGNESIUM: 2 mg/dL (ref 1.5–2.5)

## 2016-08-01 LAB — TSH: TSH: 1.03 m[IU]/L

## 2016-08-01 NOTE — Progress Notes (Signed)
Complete Physical  Assessment and Plan: Obesity - Lipid panel  Depression, remission stress management techniques discussed, increase water, good sleep hygiene discussed, increase exercise, and increase veggies.   Abnormal glucose Continue weight loss - CBC with Differential/Platelet - BASIC METABOLIC PANEL WITH GFR - Hepatic function panel - TSH - Hemoglobin A1c - Insulin, fasting   Vitamin D deficiency - Vit D  25 hydroxy (rtn osteoporosis monitoring)   Medication management - CBC with Differential/Platelet - BASIC METABOLIC PANEL WITH GFR - Hepatic function panel - Magnesium   Routine general medical examination at a health care facility - CBC with Differential/Platelet - BASIC METABOLIC PANEL WITH GFR - Hepatic function panel - TSH - Lipid panel - Hemoglobin A1c - Insulin, fasting - Magnesium - Microalbumin / creatinine urine ratio - Urinalysis, Routine w reflex microscopic (not at [facility]) - Vit D  25 hydroxy (rtn osteoporosis monitoring)  Screening for blood or protein in urine - Microalbumin / creatinine urine ratio - Urinalysis, Routine w reflex microscopic (not at [facility])   Discussed med's effects and SE's. Screening labs and tests as requested with regular follow-up as recommended.  HPI  30 y.o. female  presents for a complete physical.   Her blood pressure has been controlled at home, today their BP is BP: 118/76 She does not workout regularly but would like to start exercising more.   She denies chest pain, shortness of breath, dizziness.  Patient is not on Vitamin D supplement.   Lab Results  Component Value Date   VD25OH 31 08/01/2015   BMI is Body mass index is 39.5 kg/m. Wt Readings from Last 3 Encounters:  08/01/16 223 lb (101.2 kg)  06/23/16 215 lb (97.5 kg)  06/21/16 212 lb (96.2 kg)     Has history of insomnia/anxiety- was on lexapro in the past but has been off any medications.  Due to obesity, she has preDM, denies DM poly's.  Lab  Results  Component Value Date   HGBA1C 5.6 08/01/2015   She has a 29 year old and a 29 year old, both girls. 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    Current Medications:  Current Outpatient Prescriptions on File Prior to Visit  Medication Sig Dispense Refill  . cetirizine (ZYRTEC) 5 MG tablet Take 5 mg by mouth daily.    . fluticasone (FLONASE) 50 MCG/ACT nasal spray Place 2 sprays into both nostrils daily. 16 g 0   No current facility-administered medications on file prior to visit.    Health Maintenance:   Immunization History  Administered Date(s) Administered  . Influenza Split 08/01/2015  . Influenza-Unspecified 07/09/2016  . PPD Test 02/02/2015, 03/15/2015, 04/19/2016  . Tdap 04/24/2013   Tetanus: 2014 Pneumovax: N/A Flu vaccine: 2017 Zostavax: N/A  LMP: Patient's last menstrual period was 07/09/2016., using condoms Pap: 2015 due 2018, follows Dr. Rana SnareLowe, mirena is out in Sept MGM:N/A  DEXA: Colonoscopy: EGD:  Patient Care Team: Lucky CowboyWilliam McKeown, MD as PCP - General (Internal Medicine) Candice Campavid Lowe, MD as Consulting Physician (Obstetrics and Gynecology)  Medical History:  Past Medical History:  Diagnosis Date  . Abnormal Pap smear   . Hx of blood clots 2015   on uterus during pregnancy  . Obesity (BMI 30-39.9)    BMI 38  . Postpartum depression 04/08/2014   Allergies No Known Allergies  SURGICAL HISTORY She  has a past surgical history that includes Wisdom tooth extraction and Colposcopy (2014). FAMILY HISTORY Her family history  includes Cancer in her maternal grandmother; Hearing loss in her paternal grandfather; Lupus in her paternal grandfather. SOCIAL HISTORY She  reports that she has never smoked. She has never used smokeless tobacco. She reports that she does not drink alcohol or use drugs.   Review of Systems  Constitutional: Positive for weight loss (has been trying). Negative for chills, diaphoresis, fever and  malaise/fatigue.  HENT: Negative.   Eyes: Negative.   Respiratory: Negative.  Negative for cough and shortness of breath.   Cardiovascular: Negative.  Negative for palpitations.  Gastrointestinal: Negative.   Genitourinary: Negative.   Musculoskeletal: Positive for joint pain (knee pain). Negative for back pain, falls, myalgias and neck pain.  Skin: Negative.   Neurological: Negative.  Negative for weakness.  Psychiatric/Behavioral: Negative.  Insomnia: better with trazodone.     Physical Exam: Estimated body mass index is 39.5 kg/m as calculated from the following:   Height as of this encounter: 5\' 3"  (1.6 m).   Weight as of this encounter: 223 lb (101.2 kg). BP 118/76   Pulse (!) 109   Temp 98.1 F (36.7 C)   Resp 14   Ht 5\' 3"  (1.6 m)   Wt 223 lb (101.2 kg)   LMP 07/09/2016   SpO2 98%   BMI 39.50 kg/m  General Appearance: Well nourished, in no apparent distress.  Eyes: PERRLA, EOMs, conjunctiva no swelling or erythema, normal fundi and vessels.  Sinuses: No Frontal/maxillary tenderness  ENT/Mouth: Ext aud canals clear, normal light reflex with TMs without erythema, bulging. Good dentition. No erythema, swelling, or exudate on post pharynx. Tonsils not swollen or erythematous. Hearing normal.  Neck: Supple, thyroid normal. No bruits  Respiratory: Respiratory effort normal, BS equal bilaterally without rales, rhonchi, wheezing or stridor.  Cardio: RRR without murmurs, rubs or gallops. Brisk peripheral pulses without edema.  Chest: symmetric, with normal excursions and percussion.  Breasts:defer Abdomen: Soft, nontender, obese, no guarding, rebound, hernias, masses, or organomegaly. .  Lymphatics: right posterior lymph node chain with mobile, nontender 0.5 inch mass versus seb cyst.  Genitourinary: defer Musculoskeletal: Full ROM all peripheral extremities,5/5 strength, and normal gait.  Skin: Warm, dry without rashes, lesions, ecchymosis. Neuro: Cranial nerves intact,  reflexes equal bilaterally. Normal muscle tone, no cerebellar symptoms. Sensation intact.  Psych: Awake and oriented X 3, normal affect, Insight and Judgment appropriate.   EKG: defer   Quentin Mulling 2:13 PM [facility]

## 2016-08-01 NOTE — Patient Instructions (Addendum)
Vitamin D goal is between 60-80  Please make sure that you are taking your Vitamin D as directed.   It is very important as a natural anti-inflammatory   helping hair, skin, and nails, as well as reducing stroke and heart attack risk.   It helps your bones and helps with mood.  It also decreases numerous cancer risks so please take it as directed.   Low Vit D is associated with a 200-300% higher risk for CANCER   and 200-300% higher risk for HEART   ATTACK  &  STROKE.    .....................................Marland Kitchen.  It is also associated with higher death rate at younger ages,   autoimmune diseases like Rheumatoid arthritis, Lupus, Multiple Sclerosis.     Also many other serious conditions, like depression, Alzheimer's  Dementia, infertility, muscle aches, fatigue, fibromyalgia - just to name a few.  +++++++++++++++++++  Can get liquid vitamin D from [employer]  OR [place] at  [facility] [address], [place], [place] [address] Add 5000 IU  Try the melatonin 5mg -20mg  dissolvable or gummy 30 mins before bed  Who Qualifies for Obesity Medications? Although everyone is hopeful for a fast and easy way to lose weight, nothing has been shown to replace a prudent, calorie-controlled diet along with behavior modification as a cornerstone for all obesity treatments.  The next tool that can be used to achieve weight-loss and health improvement is medication. Pharmacotherapy may be offered to individuals affected by obesity who have failed to achieve weight-loss through diet and exercise alone. Currently there are several drugs that are approved by the FDA for weight-loss: phentermine products (Adipex-P or Suprenza)  lorcaserin HCI (Belviq) phentermine- topiramate ER (Qsymia)  Bupropion; Naltrexone ER (Contrave)  Let's take a closer look at each of these medications and learn how they work:  Phentermine (Adipex-P or Suprenza) How does it work? Phentermine is a  medication available by prescription that works on chemicals in the brain to decrease your appetite. It also has a mild stimulant component that adds extra energy. Phentermine is a pill that is taken once a day in the morning time. Tolerance to this medication can develop, so it can only be used for several months at a time. Common side effects are dry mouth, sleeplessness, constipation. Weight-loss: The average weight-loss is 4-5 percent of your weight after one-year. In a 200 pound person, this means about 10 pounds of weight-loss. Patients who receive phentermine can usually expect to see greater weight-loss than those who receive non-pharmacologic care, on average about 13 pounds difference over 12 weeks as reported in one study. Concerns: Due to its stimulant effect, a person's blood pressure and heart rate may increase when on this medication; therefore, you must be monitored closely by a physician who is experienced in prescribing this medication. It cannot be used in patients with some heart conditions (such as poorly controlled blood pressure), glaucoma (increased pressure in your eye), stroke or overactive thyroid. There is some concern for abuse, but this is minimal if the medication is appropriately used as directed by a healthcare professional.  Lorcaserin (Belviq) How does it work? Lorcaserin was approved in June 2012 by the FDA and became commercially available in June 2013. It works by helping you feel full while eating less, and it works on the chemicals in your brain to help decrease your appetite. Weight-loss: In individuals who took the medication for one-year, it has been shown to have an average of 7 percent weight-loss. In a 200 pound  person, this would mean a 14 pound weight-loss. Blood sugar, cholesterol and blood pressure levels have also been shown to improve. Concerns: The most common side effects are headache, dizziness, fatigue, dry mouth, upper ZOX:WRUEAVWU tract infection and  nausea.  Response to therapy should be evaluated by week 12.  If a patient has not lost at least 5% of baseline body weigh  Phentermine-Topiramate ER (Qsymia) How does it work? This combination medication was approved by the FDA in July 2012. Topiramate is a medication used to treat seizures. It was found that a common side effect of this medication was weight-loss. Phentermine, as described in this brochure, helps to increase your energy and decrease your appetite. Weight-loss: Among individuals who took the highest does of Qsymia (15 mg phentermine and 92 mg of topiramate ER) for one-year, they achieved an average of 14.4 percent weight-loss. In a 200 pound person, a 14.4 percent weight-loss would mean a loss of 29 pounds. Cholesterol levels have also been shown to improve. Concerns: The most common side effects were dry mouth, constipation and pins-and-needle feeling in extremities. Qsymia should NOT be taken during pregnancy since Topiramate ER, a component of Qsymia, has been associated with an increased risk of birth defects.  Bupropion; Naltrexone ER (Contrave) How does it work? Works in two areas of your brain, hunger center and reward center to reduce hunger and cravings.  Weight loss In a 56 week trial patients lost more than 5% of their body weight.  Concerns Most common side effects are dry mouth, constipation or diarrhea, headache.  Please take it with a full glass of water and low fat meal.    Follow-up Visits: Patients are given the opportunity to revisit a topic or obtain more information on an area of interest during follow-up visits. The frequency of and interval between follow-up visits is determined on a patient-by-patient basis. Frequent visits (every 3 to 4 weeks) are encouraged until initial weight-loss goals (5 to 10 percent of body weight) are achieved. At that point, less frequent visits are typically scheduled as needed for individual patients. However, since  obesity is considered a chronic life-long problem for many individuals, periodic continual follow up is recommended.   Research has shown that weight-loss as low as 5 percent of initial body weight can lead to favorable improvements in blood pressure, cholesterol, glucose levels and insulin sensitivity. The risk of developing heart disease is reduced the most in patients who have impaired glucose tolerance, type 2 diabetes or high blood pressure.

## 2016-08-02 LAB — URINALYSIS, ROUTINE W REFLEX MICROSCOPIC
Bilirubin Urine: NEGATIVE
Glucose, UA: NEGATIVE
HGB URINE DIPSTICK: NEGATIVE
Ketones, ur: NEGATIVE
NITRITE: NEGATIVE
Protein, ur: NEGATIVE
Specific Gravity, Urine: 1.029 (ref 1.001–1.035)
pH: 6 (ref 5.0–8.0)

## 2016-08-02 LAB — URINALYSIS, MICROSCOPIC ONLY
CRYSTALS: NONE SEEN [HPF]
YEAST: NONE SEEN [HPF]

## 2016-08-02 LAB — MICROALBUMIN / CREATININE URINE RATIO
CREATININE, URINE: 265 mg/dL (ref 20–320)
MICROALB/CREAT RATIO: 3 ug/mg{creat} (ref <30)
Microalb, Ur: 0.7 mg/dL

## 2016-08-02 LAB — HEMOGLOBIN A1C
Hgb A1c MFr Bld: 5.3 % (ref <5.7)
Mean Plasma Glucose: 105 mg/dL

## 2016-08-02 LAB — VITAMIN D 25 HYDROXY (VIT D DEFICIENCY, FRACTURES): VIT D 25 HYDROXY: 22 ng/mL — AB (ref 30–100)

## 2016-09-05 ENCOUNTER — Encounter: Payer: Self-pay | Admitting: Physician Assistant

## 2016-09-05 MED ORDER — PREDNISONE 20 MG PO TABS: ORAL_TABLET | ORAL | 0 refills | Status: DC

## 2016-09-05 MED ORDER — AZITHROMYCIN 250 MG PO TABS: ORAL_TABLET | ORAL | 1 refills | Status: AC

## 2016-11-26 ENCOUNTER — Encounter: Payer: Self-pay | Admitting: Physician Assistant

## 2016-12-24 ENCOUNTER — Encounter: Payer: Self-pay | Admitting: Physician Assistant

## 2017-01-15 ENCOUNTER — Encounter: Payer: Self-pay | Admitting: Physician Assistant

## 2017-04-04 ENCOUNTER — Encounter: Payer: Self-pay | Admitting: Physician Assistant

## 2017-04-22 DIAGNOSIS — M542 Cervicalgia: Secondary | ICD-10-CM | POA: Diagnosis not present

## 2017-04-22 DIAGNOSIS — M546 Pain in thoracic spine: Secondary | ICD-10-CM | POA: Diagnosis not present

## 2017-04-22 DIAGNOSIS — M5413 Radiculopathy, cervicothoracic region: Secondary | ICD-10-CM | POA: Diagnosis not present

## 2017-04-22 DIAGNOSIS — M9901 Segmental and somatic dysfunction of cervical region: Secondary | ICD-10-CM | POA: Diagnosis not present

## 2017-08-06 DIAGNOSIS — J019 Acute sinusitis, unspecified: Secondary | ICD-10-CM | POA: Diagnosis not present

## 2017-08-06 DIAGNOSIS — R05 Cough: Secondary | ICD-10-CM | POA: Diagnosis not present

## 2017-08-07 ENCOUNTER — Ambulatory Visit: Payer: Self-pay | Admitting: Physician Assistant

## 2017-08-16 ENCOUNTER — Encounter: Payer: Self-pay | Admitting: Physician Assistant

## 2017-08-21 DIAGNOSIS — N760 Acute vaginitis: Secondary | ICD-10-CM | POA: Diagnosis not present

## 2017-09-05 ENCOUNTER — Encounter: Payer: Self-pay | Admitting: Physician Assistant

## 2017-09-22 NOTE — Progress Notes (Signed)
Complete Physical  Assessment and Plan: Depression, remission partial stress management techniques discussed, increase water, good sleep hygiene discussed, increase exercise, and increase veggies.  Add on lexapro  Abnormal glucose Continue weight loss   Vitamin D deficiency - Vit D  25 hydroxy (rtn osteoporosis monitoring)   Medication management - CBC with Differential/Platelet - BASIC METABOLIC PANEL WITH GFR - Hepatic function panel - Magnesium   Routine general medical examination at a health care facility  Screening for blood or protein in urine - Microalbumin / creatinine urine ratio - Urinalysis, Routine w reflex microscopic (not [facility])  Anemia, unspecified type -     Iron,Total/Total Iron Binding Cap -     Vitamin B12  Morbid obesity (HCC) - increase veggies, decrease carbs - long discussion about weight loss, diet, and exercise  BMI 34.0-34.9,adult - increase veggies, decrease carbs - long discussion about weight loss, diet, and exercise  Seborrheic dermatitis of scalp Information given  Raynaud's phenomenon without gangrene Add on lexapro, wear warm clothes, if not better will add on CCB  Thyroid fullness -     TSH -     US THYROID; Future  Screening cholesterol level -     Lipid panel  Discussed med's effects and SE's. Screening labs and tests as requested with regular follow-up as recommended.  HPI  31 y.o. female  presents for a complete physical.   Her blood pressure has been controlled at home, today their BP is BP: 128/82 She does not workout regularly but would like to start exercising more.   She denies chest pain, shortness of breath, dizziness.  She has raynauds, states has been worse with the winter/cold. Hands and feet and would like to get on medication for me.  Patient is not on Vitamin D supplement.   Lab Results  Component Value Date   VD25OH 22 (L) 08/01/2016   BMI is Body mass index is 34.6 kg/m. Wt Readings from Last 3  Encounters:  09/23/17 201 lb 9.6 oz (91.4 kg)  08/01/16 223 lb (101.2 kg)  06/23/16 215 lb (97.5 kg)      Due to obesity, she has preDM, denies DM poly's.  Lab Results  Component Value Date   HGBA1C 5.3 08/01/2016   She has a 30 year old and a 30 year old, both girls. 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 Had a rough semester, has been on lexapro in the past and having increasing stress.  Will have to redo this semester due to poor clinical site.  BMI is Body mass index is 34.6 kg/m., she is working on diet and exercise. Wt Readings from Last 3 Encounters:  09/23/17 201 lb 9.6 oz (91.4 kg)  08/01/16 223 lb (101.2 kg)  06/23/16 215 lb (97.5 kg)      Current Medications:  Current Outpatient Medications on File Prior to Visit  Medication Sig Dispense Refill  . cetirizine (ZYRTEC) 5 MG tablet Take 5 mg by mouth daily.    . fluticasone (FLONASE) 50 MCG/ACT nasal spray Place 2 sprays into both nostrils daily. 16 g 0   No current facility-administered medications on file prior to visit.    Health Maintenance:   Immunization History  Administered Date(s) Administered  . Influenza Split 08/01/2015  . Influenza-Unspecified 07/09/2016  . PPD Test 02/02/2015, 03/15/2015, 04/19/2016  . Tdap 04/24/2013   Tetanus: 2014 Pneumovax: N/A Flu vaccine: 2017 Zostavax: N/A  LMP: Patient's last menstrual period was 08/31/2017.,  using condoms Pap: 2015 due 2018, follows Dr. Rana SnareLowe, Toney Reilmirena is out in Sept MGM:N/A  DEXA: Colonoscopy: EGD:  Patient Care Team: Lucky CowboyMcKeown, William, MD as PCP - General (Internal Medicine) Candice CampLowe, David, MD as Consulting Physician (Obstetrics and Gynecology)  Medical History:  Past Medical History:  Diagnosis Date  . Abnormal Pap smear   . Hx of blood clots 2015   on uterus during pregnancy  . Obesity (BMI 30-39.9)    BMI 38  . Postpartum depression 04/08/2014   Allergies No Known Allergies  SURGICAL HISTORY She  has a past  surgical history that includes Wisdom tooth extraction and Colposcopy (2014). FAMILY HISTORY Her family history includes Cancer in her maternal grandmother; Hearing loss in her paternal grandfather; Lupus in her paternal grandfather. SOCIAL HISTORY She  reports that  has never smoked. she has never used smokeless tobacco. She reports that she does not drink alcohol or use drugs.   Review of Systems  Constitutional: Positive for weight loss (has been trying). Negative for chills, diaphoresis, fever and malaise/fatigue.  HENT: Negative.   Eyes: Negative.   Respiratory: Negative.  Negative for cough and shortness of breath.   Cardiovascular: Negative.  Negative for palpitations.  Gastrointestinal: Negative.   Genitourinary: Negative.   Musculoskeletal: Positive for joint pain (knee pain). Negative for back pain, falls, myalgias and neck pain.  Skin: Negative.   Neurological: Negative.  Negative for weakness.  Psychiatric/Behavioral: Negative.  Insomnia: better with trazodone.     Physical Exam: Estimated body mass index is 34.6 kg/m as calculated from the following:   Height as of this encounter: 5\' 4"  (1.626 m).   Weight as of this encounter: 201 lb 9.6 oz (91.4 kg). BP 128/82   Pulse (!) 101   Temp 97.7 F (36.5 C)   Resp 16   Ht 5\' 4"  (1.626 m)   Wt 201 lb 9.6 oz (91.4 kg)   LMP 08/31/2017   SpO2 99%   BMI 34.60 kg/m  General Appearance: Well nourished, in no apparent distress.  Eyes: PERRLA, EOMs, conjunctiva no swelling or erythema, normal fundi and vessels.  Sinuses: No Frontal/maxillary tenderness  ENT/Mouth: Ext aud canals clear, normal light reflex with TMs without erythema, bulging. Good dentition. No erythema, swelling, or exudate on post pharynx. Tonsils not swollen or erythematous. Hearing normal.  Neck: Supple, thyroid fullness, left posterior lymph node enlargement. No bruits  Respiratory: Respiratory effort normal, BS equal bilaterally without rales, rhonchi,  wheezing or stridor.  Cardio: RRR without murmurs, rubs or gallops. Brisk peripheral pulses without edema.  Chest: symmetric, with normal excursions and percussion.  Breasts:defer Abdomen: Soft, nontender, obese, no guarding, rebound, hernias, masses, or organomegaly. . Lymphatics: left posterior lymph node chain enlargement, nontender Genitourinary: defer Musculoskeletal: Full ROM all peripheral extremities,5/5 strength, and normal gait.  Skin: seb dermatitis on hair/forehead. Warm, dry without rashes, lesions, ecchymosis. Neuro: Cranial nerves intact, reflexes equal bilaterally. Normal muscle tone, no cerebellar symptoms. Sensation intact.  Psych: Awake and oriented X 3, normal affect, Insight and Judgment appropriate.   EKG: defer   Quentin MullingAmanda Trysta Showman 10:31 AM [facility]

## 2017-09-23 ENCOUNTER — Ambulatory Visit: Payer: BLUE CROSS/BLUE SHIELD | Admitting: Physician Assistant

## 2017-09-23 ENCOUNTER — Encounter: Payer: Self-pay | Admitting: Physician Assistant

## 2017-09-23 VITALS — BP 128/82 | HR 101 | Temp 97.7°F | Resp 16 | Ht 64.0 in | Wt 201.6 lb

## 2017-09-23 DIAGNOSIS — E559 Vitamin D deficiency, unspecified: Secondary | ICD-10-CM

## 2017-09-23 DIAGNOSIS — F3341 Major depressive disorder, recurrent, in partial remission: Secondary | ICD-10-CM

## 2017-09-23 DIAGNOSIS — O99345 Other mental disorders complicating the puerperium: Principal | ICD-10-CM

## 2017-09-23 DIAGNOSIS — Z0000 Encounter for general adult medical examination without abnormal findings: Secondary | ICD-10-CM | POA: Diagnosis not present

## 2017-09-23 DIAGNOSIS — Z79899 Other long term (current) drug therapy: Secondary | ICD-10-CM | POA: Diagnosis not present

## 2017-09-23 DIAGNOSIS — I10 Essential (primary) hypertension: Secondary | ICD-10-CM

## 2017-09-23 DIAGNOSIS — E0789 Other specified disorders of thyroid: Secondary | ICD-10-CM

## 2017-09-23 DIAGNOSIS — L219 Seborrheic dermatitis, unspecified: Secondary | ICD-10-CM

## 2017-09-23 DIAGNOSIS — F53 Postpartum depression: Secondary | ICD-10-CM

## 2017-09-23 DIAGNOSIS — Z13220 Encounter for screening for lipoid disorders: Secondary | ICD-10-CM

## 2017-09-23 DIAGNOSIS — D649 Anemia, unspecified: Secondary | ICD-10-CM | POA: Diagnosis not present

## 2017-09-23 DIAGNOSIS — Z1389 Encounter for screening for other disorder: Secondary | ICD-10-CM

## 2017-09-23 DIAGNOSIS — Z6834 Body mass index (BMI) 34.0-34.9, adult: Secondary | ICD-10-CM

## 2017-09-23 DIAGNOSIS — I7300 Raynaud's syndrome without gangrene: Secondary | ICD-10-CM

## 2017-09-23 MED ORDER — ESCITALOPRAM OXALATE 10 MG PO TABS: 10.0000 mg | ORAL_TABLET | Freq: Every day | ORAL | 1 refills | Status: DC

## 2017-09-23 NOTE — Patient Instructions (Signed)
Vitamin D goal is between 60-80  Please make sure that you are taking your Vitamin D as directed.   It is very important as a natural anti-inflammatory   helping hair, skin, and nails, as well as reducing stroke and heart attack risk.   It helps your bones and helps with mood.  We want you on at least 5000 IU daily  It also decreases numerous cancer risks so please take it as directed.   Low Vit D is associated with a 200-300% higher risk for CANCER   and 200-300% higher risk for HEART   ATTACK  &  STROKE.    .....................................Marland Kitchen.  It is also associated with higher death rate at younger ages,   autoimmune diseases like Rheumatoid arthritis, Lupus, Multiple Sclerosis.     Also many other serious conditions, like depression, Alzheimer's  Dementia, infertility, muscle aches, fatigue, fibromyalgia - just to name a few.  +++++++++++++++++++  Can get liquid vitamin D from [employer]  OR [place] at  [facility] [address], [place], [place] [id number] Or you can try earth [employer]    Seborrheic Dermatitis, Adult Seborrheic dermatitis is a skin disease that causes red, scaly patches. It usually occurs on the scalp, and it is often called dandruff. The patches may appear on other parts of the body. Skin patches tend to appear where there are many oil glands in the skin. Areas of the body that are commonly affected include:  Scalp.  Skin folds of the body.  Ears.  Eyebrows.  Neck.  Face.  Armpits.  The bearded area of men's faces.  The condition may come and go for no known reason, and it is often long-lasting (chronic). What are the causes? The cause of this condition is not known. What increases the risk? This condition is more likely to develop in people who:  Have certain conditions, such as: ? HIV (human immunodeficiency virus). ? AIDS (acquired immunodeficiency syndrome). ? Parkinson disease. ? Mood disorders, such as  depression.  Are 4240-30 years old.  What are the signs or symptoms? Symptoms of this condition include:  Thick scales on the scalp.  Redness on the face or in the armpits.  Skin that is flaky. The flakes may be white or yellow.  Skin that seems oily or dry but is not helped with moisturizers.  Itching or burning in the affected areas.  How is this diagnosed? This condition is diagnosed with a medical history and physical exam. A sample of your skin may be tested (skin biopsy). You may need to see a skin specialist (dermatologist). How is this treated? There is no cure for this condition, but treatment can help to manage the symptoms. You may get treatment to remove scales, lower the risk of skin infection, and reduce swelling or itching. Treatment may include:  Creams that reduce swelling and irritation (steroids).  Creams that reduce skin yeast.  Medicated shampoo, soaps, moisturizing creams, or ointments.  Medicated moisturizing creams or ointments.  Follow these instructions at home:  Apply over-the-counter and prescription medicines only as told by your health care provider.  Use any medicated shampoo, soaps, skin creams, or ointments only as told by your health care provider.  Keep all follow-up visits as told by your health care provider. This is important. Contact a health care provider if:  Your symptoms do not improve with treatment.  Your symptoms get worse.  You have new symptoms. This information is not intended to replace advice given to  you by your health care provider. Make sure you discuss any questions you have with your health care provider. Document Released: 09/24/2005 Document Revised: 04/13/2016 Document Reviewed: 01/12/2016 Elsevier Interactive Patient Education  2018 [employer]  Raynaud Phenomenon Raynaud phenomenon is a condition that affects the blood vessels (arteries) that carry blood to your fingers and toes. The arteries that supply  blood to your ears or the tip of your nose might also be affected. Raynaud phenomenon causes the arteries to temporarily narrow. As a result, the flow of blood to the affected areas is temporarily decreased. This usually occurs in response to cold temperatures or stress. During an attack, the skin in the affected areas turns white. You may also feel tingling or numbness in those areas. Attacks usually last for only a brief period, and then the blood flow to the area returns to normal. In most cases, Raynaud phenomenon does not cause serious health problems. What are the causes? For many people with this condition, the cause is not known. Raynaud phenomenon is sometimes associated with other diseases, such as scleroderma or lupus. What increases the risk? Raynaud phenomenon can affect anyone, but it develops most often in people who are 109-35 years old. It affects more females than males. What are the signs or symptoms? Symptoms of Raynaud phenomenon may occur when you are exposed to cold temperatures or when you have emotional stress. The symptoms may last for a few minutes or up to several hours. They usually affect your fingers but may also affect your toes, ears, or the tip of your nose. Symptoms may include:  Changes in skin color. The skin in the affected areas will turn pale or white. The skin may then change from white to bluish to red as normal blood flow returns to the area.  Numbness, tingling, or pain in the affected areas.  In severe cases, sores may develop in the affected areas. How is this diagnosed? Your health care provider will do a physical exam and take your medical history. You may be asked to put your hands in cold water to check for a reaction to cold temperature. Blood tests may be done to check for other diseases or conditions. Your health care provider may also order a test to check the movement of blood through your arteries and veins (vascular ultrasound). How is this  treated? Treatment often involves making lifestyle changes and taking steps to control your exposure to cold temperatures. For more severe cases, medicine (calcium channel blockers) may be used to improve blood flow. Surgery is sometimes done to block the nerves that control the affected arteries, but this is rare. Follow these instructions at home:  Avoid exposure to cold by taking these steps: ? If possible, stay indoors during cold weather. ? When you go outside during cold weather, dress in layers and wear mittens, a hat, a scarf, and warm footwear. ? Wear mittens or gloves when handling ice or frozen food. ? Use holders for glasses or cans containing cold drinks. ? Let warm water run for a while before taking a shower or bath. ? Warm up the car before driving in cold weather.  If possible, avoid stressful and emotional situations. Exercise, meditation, and yoga may help you cope with stress. Biofeedback may be useful.  Do not use any tobacco products, including cigarettes, chewing tobacco, or electronic cigarettes. If you need help quitting, ask your health care provider.  Avoid secondhand smoke.  Limit your use of caffeine. Switch to decaffeinated  coffee, tea, and soda. Avoid chocolate.  Wear loose fitting socks and comfortable, roomy shoes.  Avoid vibrating tools and machinery.  Take medicines only as directed by your health care provider. Contact a health care provider if:  Your discomfort becomes worse despite lifestyle changes.  You develop sores on your fingers or toes that do not heal.  Your fingers or toes turn black.  You have breaks in the skin on your fingers or toes.  You have a fever.  You have pain or swelling in your joints.  You have a rash.  Your symptoms occur on only one side of your body. This information is not intended to replace advice given to you by your health care provider. Make sure you discuss any questions you have with your health care  provider. Document Released: 09/21/2000 Document Revised: 03/01/2016 Document Reviewed: 03/28/2016 Elsevier Interactive Patient Education  2017 [employer]

## 2017-09-24 LAB — IRON, TOTAL/TOTAL IRON BINDING CAP
%SAT: 16 % (calc) (ref 11–50)
IRON: 50 ug/dL (ref 40–190)
TIBC: 317 ug/dL (ref 250–450)

## 2017-09-24 LAB — URINALYSIS, ROUTINE W REFLEX MICROSCOPIC
BILIRUBIN URINE: NEGATIVE
Bacteria, UA: NONE SEEN /HPF
GLUCOSE, UA: NEGATIVE
HGB URINE DIPSTICK: NEGATIVE
Hyaline Cast: NONE SEEN /LPF
Ketones, ur: NEGATIVE
NITRITE: NEGATIVE
PROTEIN: NEGATIVE
Specific Gravity, Urine: 1.022 (ref 1.001–1.03)

## 2017-09-24 LAB — CBC WITH DIFFERENTIAL/PLATELET
BASOS PCT: 0.4 %
Basophils Absolute: 19 cells/uL (ref 0–200)
EOS PCT: 1.5 %
Eosinophils Absolute: 71 cells/uL (ref 15–500)
HCT: 45.2 % — ABNORMAL HIGH (ref 35.0–45.0)
Hemoglobin: 14.7 g/dL (ref 11.7–15.5)
Lymphs Abs: 1222 cells/uL (ref 850–3900)
MCH: 27 pg (ref 27.0–33.0)
MCHC: 32.5 g/dL (ref 32.0–36.0)
MCV: 83.1 fL (ref 80.0–100.0)
MPV: 10.4 fL (ref 7.5–12.5)
Monocytes Relative: 10.7 %
NEUTROS PCT: 61.4 %
Neutro Abs: 2886 cells/uL (ref 1500–7800)
PLATELETS: 258 10*3/uL (ref 140–400)
RBC: 5.44 10*6/uL — AB (ref 3.80–5.10)
RDW: 13.6 % (ref 11.0–15.0)
TOTAL LYMPHOCYTE: 26 %
WBC: 4.7 10*3/uL (ref 3.8–10.8)
WBCMIX: 503 {cells}/uL (ref 200–950)

## 2017-09-24 LAB — LIPID PANEL
Cholesterol: 152 mg/dL (ref <200)
HDL: 32 mg/dL — ABNORMAL LOW (ref >50)
LDL CHOLESTEROL (CALC): 100 mg/dL — AB
Non-HDL Cholesterol (Calc): 120 mg/dL (calc) (ref <130)
TRIGLYCERIDES: 106 mg/dL (ref <150)
Total CHOL/HDL Ratio: 4.8 (calc) (ref <5.0)

## 2017-09-24 LAB — BASIC METABOLIC PANEL WITH GFR
BUN: 10 mg/dL (ref 7–25)
CALCIUM: 9.4 mg/dL (ref 8.6–10.2)
CHLORIDE: 105 mmol/L (ref 98–110)
CO2: 26 mmol/L (ref 20–32)
Creat: 0.7 mg/dL (ref 0.50–1.10)
GFR, EST AFRICAN AMERICAN: 135 mL/min/{1.73_m2} (ref >=60)
GFR, Est Non African American: 116 mL/min/{1.73_m2} (ref >=60)
Glucose, Bld: 92 mg/dL (ref 65–99)
POTASSIUM: 4.5 mmol/L (ref 3.5–5.3)
Sodium: 138 mmol/L (ref 135–146)

## 2017-09-24 LAB — HEPATIC FUNCTION PANEL
AG Ratio: 1.2 (calc) (ref 1.0–2.5)
ALBUMIN MSPROF: 4.3 g/dL (ref 3.6–5.1)
ALT: 11 U/L (ref 6–29)
AST: 13 U/L (ref 10–30)
Alkaline phosphatase (APISO): 76 U/L (ref 33–115)
BILIRUBIN DIRECT: 0.1 mg/dL (ref 0.0–0.2)
GLOBULIN: 3.7 g/dL (ref 1.9–3.7)
Indirect Bilirubin: 0.4 mg/dL (calc) (ref 0.2–1.2)
TOTAL PROTEIN: 8 g/dL (ref 6.1–8.1)
Total Bilirubin: 0.5 mg/dL (ref 0.2–1.2)

## 2017-09-24 LAB — VITAMIN D 25 HYDROXY (VIT D DEFICIENCY, FRACTURES): Vit D, 25-Hydroxy: 16 ng/mL — ABNORMAL LOW (ref 30–100)

## 2017-09-24 LAB — MAGNESIUM: MAGNESIUM: 2.1 mg/dL (ref 1.5–2.5)

## 2017-09-24 LAB — TSH: TSH: 4.49 m[IU]/L

## 2017-09-24 LAB — MICROALBUMIN / CREATININE URINE RATIO
Creatinine, Urine: 189 mg/dL (ref 20–275)
MICROALB/CREAT RATIO: 4 ug/mg{creat} (ref <30)
Microalb, Ur: 0.7 mg/dL

## 2017-09-24 LAB — VITAMIN B12: Vitamin B-12: 493 pg/mL (ref 200–1100)

## 2017-10-04 ENCOUNTER — Ambulatory Visit
Admission: RE | Admit: 2017-10-04 | Discharge: 2017-10-04 | Disposition: A | Discharge status: Home / Self Care | Payer: BLUE CROSS/BLUE SHIELD | Source: Ambulatory Visit | Attending: Physician Assistant | Admitting: Physician Assistant

## 2017-10-04 DIAGNOSIS — E0789 Other specified disorders of thyroid: Secondary | ICD-10-CM

## 2017-10-04 DIAGNOSIS — E079 Disorder of thyroid, unspecified: Secondary | ICD-10-CM | POA: Diagnosis not present

## 2017-11-08 DIAGNOSIS — Z01419 Encounter for gynecological examination (general) (routine) without abnormal findings: Secondary | ICD-10-CM | POA: Diagnosis not present

## 2017-11-08 DIAGNOSIS — Z6834 Body mass index (BMI) 34.0-34.9, adult: Secondary | ICD-10-CM | POA: Diagnosis not present

## 2018-01-02 DIAGNOSIS — M9903 Segmental and somatic dysfunction of lumbar region: Secondary | ICD-10-CM | POA: Diagnosis not present

## 2018-01-02 DIAGNOSIS — M5417 Radiculopathy, lumbosacral region: Secondary | ICD-10-CM | POA: Diagnosis not present

## 2018-01-02 DIAGNOSIS — M545 Low back pain: Secondary | ICD-10-CM | POA: Diagnosis not present

## 2018-01-02 DIAGNOSIS — M546 Pain in thoracic spine: Secondary | ICD-10-CM | POA: Diagnosis not present

## 2018-01-21 ENCOUNTER — Encounter: Payer: Self-pay | Admitting: Physician Assistant

## 2018-01-22 ENCOUNTER — Other Ambulatory Visit: Payer: Self-pay | Admitting: Physician Assistant

## 2018-01-22 MED ORDER — PREDNISONE 20 MG PO TABS: ORAL_TABLET | ORAL | 0 refills | Status: DC

## 2018-05-08 ENCOUNTER — Encounter: Payer: Self-pay | Admitting: Physician Assistant

## 2018-06-18 DIAGNOSIS — M5413 Radiculopathy, cervicothoracic region: Secondary | ICD-10-CM | POA: Diagnosis not present

## 2018-06-18 DIAGNOSIS — M9901 Segmental and somatic dysfunction of cervical region: Secondary | ICD-10-CM | POA: Diagnosis not present

## 2018-06-18 DIAGNOSIS — M542 Cervicalgia: Secondary | ICD-10-CM | POA: Diagnosis not present

## 2018-06-18 DIAGNOSIS — M546 Pain in thoracic spine: Secondary | ICD-10-CM | POA: Diagnosis not present

## 2018-09-10 DIAGNOSIS — Z3009 Encounter for other general counseling and advice on contraception: Secondary | ICD-10-CM | POA: Diagnosis not present

## 2018-10-07 ENCOUNTER — Encounter: Payer: Self-pay | Admitting: Physician Assistant

## 2018-10-07 DIAGNOSIS — Z30433 Encounter for removal and reinsertion of intrauterine contraceptive device: Secondary | ICD-10-CM | POA: Diagnosis not present

## 2018-10-11 NOTE — Progress Notes (Signed)
Patient ID: Jasmine Espinoza, female   DOB: 06-10-1986, 33 y.o.   MRN: [id number] Complete Physical  Assessment and Plan: Depression, remission partial stress management techniques discussed, increase water, good sleep hygiene discussed, increase exercise, and increase veggies.   Abnormal glucose Continue weight loss   Vitamin D deficiency - Vit D  25 hydroxy (rtn osteoporosis monitoring)   Medication management - CBC with Differential/Platelet - BASIC METABOLIC PANEL WITH GFR - Hepatic function panel - Magnesium   Routine general medical examination at a health care facility  Screening for blood or protein in urine - Microalbumin / creatinine urine ratio - Urinalysis, Routine w reflex microscopic (not at [facility])  Morbid obesity (HCC) - increase veggies, decrease carbs - long discussion about weight loss, diet, and exercise  Seborrheic dermatitis of scalp Information given  Raynaud's phenomenon without gangrene wear warm clothes, if not better will add on CCB  Screening cholesterol level -     Lipid panel  Discussed med's effects and SE's. Screening labs and tests as requested with regular follow-up as recommended.  HPI  33 y.o. female  presents for a complete physical.   Her blood pressure has been controlled at home, today their BP is BP: 128/70 She does not workout regularly but would like to start exercising more.   She denies chest pain, shortness of breath, dizziness.  She has raynauds, states has been worse with the winter/cold.  Hands and feet and would like to get on medication for me.  Patient is not on Vitamin D supplement.   Lab Results  Component Value Date   VD25OH 16 (L) 09/23/2017   BMI is Body mass index is 34.91 kg/m. Wt Readings from Last 3 Encounters:  10/13/18 200 lb 3.2 oz (90.8 kg)  09/23/17 201 lb 9.6 oz (91.4 kg)  08/01/16 223 lb (101.2 kg)      Due to obesity, she has preDM, denies DM poly's.  Lab Results  Component Value Date   HGBA1C 5.3 08/01/2016   She has a 32 year old and a 32 year old, both girls, competitive dance. 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   She is on mirena which is helping  BMI is Body mass index is 34.91 kg/m., she is working on diet and exercise. Wt Readings from Last 3 Encounters:  10/13/18 200 lb 3.2 oz (90.8 kg)  09/23/17 201 lb 9.6 oz (91.4 kg)  08/01/16 223 lb (101.2 kg)      Current Medications:  Current Outpatient Medications on File Prior to Visit  Medication Sig Dispense Refill  . cetirizine (ZYRTEC) 5 MG tablet Take 5 mg by mouth daily.    . fluticasone (FLONASE) 50 MCG/ACT nasal spray Place 2 sprays into both nostrils daily. 16 g 0   No current facility-administered medications on file prior to visit.    Health Maintenance:   Immunization History  Administered Date(s) Administered  . Influenza Split 08/01/2015  . Influenza-Unspecified 07/09/2016  . PPD Test 02/02/2015, 03/15/2015, 04/19/2016  . Tdap 04/24/2013   Tetanus: 2014 Pneumovax: N/A Flu vaccine: 2017 DUE Zostavax: N/A  LMP: Patient's last menstrual period was 10/01/2018., using condoms Pap: 2018, follows Dr. Rana Snare, mirena replaced MGM:N/A  DEXA: Colonoscopy: EGD:  Patient Care Team: Lucky Cowboy, MD as PCP - General (Internal Medicine) Candice Camp, MD as Consulting Physician (Obstetrics and Gynecology)  Medical History:  Past Medical History:  Diagnosis Date  . Abnormal Pap smear   .  Hx of blood clots 2015   on uterus during pregnancy  . Obesity (BMI 30-39.9)    BMI 38  . Postpartum depression 04/08/2014   Allergies No Known Allergies  SURGICAL HISTORY She  has a past surgical history that includes Wisdom tooth extraction and Colposcopy (2014). FAMILY HISTORY Her family history includes Cancer in her maternal grandmother; Hearing loss in her paternal grandfather; Lupus in her paternal grandfather. SOCIAL HISTORY She  reports that she has never  smoked. She has never used smokeless tobacco. She reports that she does not drink alcohol or use drugs.   Review of Systems  Constitutional: Positive for weight loss (has been trying). Negative for chills, diaphoresis, fever and malaise/fatigue.  HENT: Negative.   Eyes: Negative.   Respiratory: Negative.  Negative for cough and shortness of breath.   Cardiovascular: Negative.  Negative for palpitations.  Gastrointestinal: Negative.   Genitourinary: Negative.   Musculoskeletal: Positive for joint pain (knee pain). Negative for back pain, falls, myalgias and neck pain.  Skin: Negative.   Neurological: Negative.  Negative for weakness.  Psychiatric/Behavioral: Negative.  Insomnia: better with trazodone.     Physical Exam: Estimated body mass index is 34.91 kg/m as calculated from the following:   Height as of this encounter: 5' 3.5" (1.613 m).   Weight as of this encounter: 200 lb 3.2 oz (90.8 kg). BP 128/70   Pulse 90   Temp 98.3 F (36.8 C)   Ht 5' 3.5" (1.613 m)   Wt 200 lb 3.2 oz (90.8 kg)   LMP 10/01/2018   SpO2 99%   BMI 34.91 kg/m  General Appearance: Well nourished, in no apparent distress.  Eyes: PERRLA, EOMs, conjunctiva no swelling or erythema, normal fundi and vessels.  Sinuses: No Frontal/maxillary tenderness  ENT/Mouth: Ext aud canals clear, normal light reflex with TMs without erythema, bulging. Good dentition. No erythema, swelling, or exudate on post pharynx. Tonsils not swollen or erythematous. Hearing normal.  Neck: Supple, thyroid fullness, left posterior lymph node enlargement. No bruits  Respiratory: Respiratory effort normal, BS equal bilaterally without rales, rhonchi, wheezing or stridor.  Cardio: RRR without murmurs, rubs or gallops. Brisk peripheral pulses without edema.  Chest: symmetric, with normal excursions and percussion.  Breasts:defer Abdomen: Soft, nontender, obese, no guarding, rebound, hernias, masses, or organomegaly. . Lymphatics: left  posterior lymph node chain enlargement, nontender Genitourinary: defer Musculoskeletal: Full ROM all peripheral extremities,5/5 strength, and normal gait.  Skin: seb dermatitis on hair/forehead. Warm, dry without rashes, lesions, ecchymosis. Neuro: Cranial nerves intact, reflexes equal bilaterally. Normal muscle tone, no cerebellar symptoms. Sensation intact.  Psych: Awake and oriented X 3, normal affect, Insight and Judgment appropriate.   EKG: defer   Quentin Mulling 10:16 AM [facility]

## 2018-10-13 ENCOUNTER — Ambulatory Visit: Payer: BLUE CROSS/BLUE SHIELD | Admitting: Physician Assistant

## 2018-10-13 ENCOUNTER — Encounter: Payer: Self-pay | Admitting: Physician Assistant

## 2018-10-13 VITALS — BP 128/70 | HR 90 | Temp 98.3°F | Ht 63.5 in | Wt 200.2 lb

## 2018-10-13 DIAGNOSIS — Z1329 Encounter for screening for other suspected endocrine disorder: Secondary | ICD-10-CM

## 2018-10-13 DIAGNOSIS — O99345 Other mental disorders complicating the puerperium: Principal | ICD-10-CM

## 2018-10-13 DIAGNOSIS — Z13220 Encounter for screening for lipoid disorders: Secondary | ICD-10-CM

## 2018-10-13 DIAGNOSIS — F53 Postpartum depression: Secondary | ICD-10-CM

## 2018-10-13 DIAGNOSIS — E559 Vitamin D deficiency, unspecified: Secondary | ICD-10-CM

## 2018-10-13 DIAGNOSIS — Z79899 Other long term (current) drug therapy: Secondary | ICD-10-CM | POA: Diagnosis not present

## 2018-10-13 DIAGNOSIS — Z1389 Encounter for screening for other disorder: Secondary | ICD-10-CM | POA: Diagnosis not present

## 2018-10-13 DIAGNOSIS — Z0000 Encounter for general adult medical examination without abnormal findings: Secondary | ICD-10-CM | POA: Diagnosis not present

## 2018-10-13 DIAGNOSIS — D649 Anemia, unspecified: Secondary | ICD-10-CM

## 2018-10-13 DIAGNOSIS — F3341 Major depressive disorder, recurrent, in partial remission: Secondary | ICD-10-CM

## 2018-10-13 MED ORDER — VITAMIN D (ERGOCALCIFEROL) 1.25 MG (50000 UNIT) PO CAPS: ORAL_CAPSULE | ORAL | 0 refills | Status: DC

## 2018-10-13 NOTE — Patient Instructions (Addendum)
VITAMIN D IS IMPORTANT  Vitamin D goal is between 60-80  Please make sure that you are taking your Vitamin D as directed.   It is very important as a natural anti-inflammatory   helping hair, skin, and nails, as well as reducing stroke and heart attack risk.   It helps your bones and helps with mood.  We want you on at least 5000 IU daily  It also decreases numerous cancer risks so please take it as directed.   Low Vit D is associated with a 200-300% higher risk for CANCER   and 200-300% higher risk for HEART   ATTACK  &  STROKE.    .....................................Marland Kitchen  It is also associated with higher death rate at younger ages,   autoimmune diseases like Rheumatoid arthritis, Lupus, Multiple Sclerosis.     Also many other serious conditions, like depression, Alzheimer's  Dementia, infertility, muscle aches, fatigue, fibromyalgia - just to name a few.  +++++++++++++++++++  Can get liquid vitamin D from [place]  OR here in [place] at  [facility] alternatives [address], [place], [place] [address] Or you can try earth fare    Google mindful eating and here are some tips and tricks below.   Rate your hunger before you eat on a scale of 1-10, try to eat closer to a 6 or higher. And if you are at below that, why are you eating? Slow down and listen to your body.          When it comes to diets, agreement about the perfect plan isn't easy to find, even among the experts. Experts at the [facility] [employer] developed an idea known as the Healthy Eating Plate. Just imagine a plate divided into logical, healthy portions.  The emphasis is on diet quality:  Load up on vegetables and fruits - one-half of your plate: Aim for color and variety, and remember that potatoes don't count.  Go for whole grains - one-quarter of your plate: Whole wheat, barley, wheat berries, quinoa, oats, brown rice, and foods made with them. If you want pasta, go with whole wheat  pasta.  Protein power - one-quarter of your plate: Fish, chicken, beans, and nuts are all healthy, versatile protein sources. Limit red meat.  The diet, however, does go beyond the plate, offering a few other suggestions.  Use healthy plant oils, such as olive, canola, soy, corn, sunflower and peanut. Check the labels, and avoid partially hydrogenated oil, which have unhealthy trans fats.  If you're thirsty, drink water. Coffee and tea are good in moderation, but skip sugary drinks and limit milk and dairy products to one or two daily servings.  The type of carbohydrate in the diet is more important than the amount. Some sources of carbohydrates, such as vegetables, fruits, whole grains, and beans-are healthier than others.  Finally, stay active.

## 2018-10-14 LAB — COMPLETE METABOLIC PANEL WITH GFR
AG Ratio: 1.2 (calc) (ref 1.0–2.5)
ALT: 9 U/L (ref 6–29)
AST: 12 U/L (ref 10–30)
Albumin: 3.9 g/dL (ref 3.6–5.1)
Alkaline phosphatase (APISO): 72 U/L (ref 33–115)
BUN: 7 mg/dL (ref 7–25)
CO2: 28 mmol/L (ref 20–32)
Calcium: 9 mg/dL (ref 8.6–10.2)
Chloride: 105 mmol/L (ref 98–110)
Creat: 0.66 mg/dL (ref 0.50–1.10)
GFR, Est African American: 136 mL/min/{1.73_m2} (ref >=60)
GFR, Est Non African American: 118 mL/min/{1.73_m2} (ref >=60)
Globulin: 3.2 g/dL (calc) (ref 1.9–3.7)
Glucose, Bld: 80 mg/dL (ref 65–99)
Potassium: 4.8 mmol/L (ref 3.5–5.3)
Sodium: 139 mmol/L (ref 135–146)
Total Bilirubin: 0.3 mg/dL (ref 0.2–1.2)
Total Protein: 7.1 g/dL (ref 6.1–8.1)

## 2018-10-14 LAB — CBC WITH DIFFERENTIAL/PLATELET
Absolute Monocytes: 498 cells/uL (ref 200–950)
BASOS PCT: 0.4 %
Basophils Absolute: 19 cells/uL (ref 0–200)
EOS PCT: 1.1 %
Eosinophils Absolute: 52 cells/uL (ref 15–500)
HCT: 41.4 % (ref 35.0–45.0)
HEMOGLOBIN: 13.4 g/dL (ref 11.7–15.5)
Lymphs Abs: 1260 cells/uL (ref 850–3900)
MCH: 27.6 pg (ref 27.0–33.0)
MCHC: 32.4 g/dL (ref 32.0–36.0)
MCV: 85.2 fL (ref 80.0–100.0)
MONOS PCT: 10.6 %
MPV: 10.1 fL (ref 7.5–12.5)
NEUTROS ABS: 2872 {cells}/uL (ref 1500–7800)
Neutrophils Relative %: 61.1 %
PLATELETS: 266 10*3/uL (ref 140–400)
RBC: 4.86 10*6/uL (ref 3.80–5.10)
RDW: 13.2 % (ref 11.0–15.0)
TOTAL LYMPHOCYTE: 26.8 %
WBC: 4.7 10*3/uL (ref 3.8–10.8)

## 2018-10-14 LAB — URINALYSIS, ROUTINE W REFLEX MICROSCOPIC
Bacteria, UA: NONE SEEN /HPF
Bilirubin Urine: NEGATIVE
Glucose, UA: NEGATIVE
Hgb urine dipstick: NEGATIVE
Hyaline Cast: NONE SEEN /LPF
Ketones, ur: NEGATIVE
Nitrite: NEGATIVE
Protein, ur: NEGATIVE
Specific Gravity, Urine: 1.022 (ref 1.001–1.03)
pH: 6.5 (ref 5.0–8.0)

## 2018-10-14 LAB — LIPID PANEL
Cholesterol: 137 mg/dL (ref <200)
HDL: 34 mg/dL — AB (ref >50)
LDL Cholesterol (Calc): 78 mg/dL (calc)
NON-HDL CHOLESTEROL (CALC): 103 mg/dL (ref <130)
TRIGLYCERIDES: 152 mg/dL — AB (ref <150)
Total CHOL/HDL Ratio: 4 (calc) (ref <5.0)

## 2018-10-14 LAB — TSH: TSH: 3.48 mIU/L

## 2018-10-14 LAB — MICROALBUMIN / CREATININE URINE RATIO
CREATININE, URINE: 139 mg/dL (ref 20–275)
Microalb Creat Ratio: 4 mcg/mg creat (ref <30)
Microalb, Ur: 0.5 mg/dL

## 2018-10-14 LAB — MAGNESIUM: Magnesium: 2 mg/dL (ref 1.5–2.5)

## 2018-10-14 LAB — VITAMIN D 25 HYDROXY (VIT D DEFICIENCY, FRACTURES): Vit D, 25-Hydroxy: 16 ng/mL — ABNORMAL LOW (ref 30–100)

## 2018-11-20 DIAGNOSIS — Z30431 Encounter for routine checking of intrauterine contraceptive device: Secondary | ICD-10-CM | POA: Diagnosis not present

## 2018-11-20 DIAGNOSIS — Z6835 Body mass index (BMI) 35.0-35.9, adult: Secondary | ICD-10-CM | POA: Diagnosis not present

## 2018-11-20 DIAGNOSIS — Z01419 Encounter for gynecological examination (general) (routine) without abnormal findings: Secondary | ICD-10-CM | POA: Diagnosis not present

## 2019-08-03 IMAGING — US US THYROID
1 series · 14 of 25 positions shown · non-contrast
Comparison: None.

CLINICAL DATA: Thyroid fullness

EXAM:
THYROID ULTRASOUND
TECHNIQUE: Ultrasound examination of the thyroid gland and adjacent soft
tissues was performed.

[Series 1: us thyroid · 0.04mm/px · 14 of 48 slices shown]
[im 1/48]
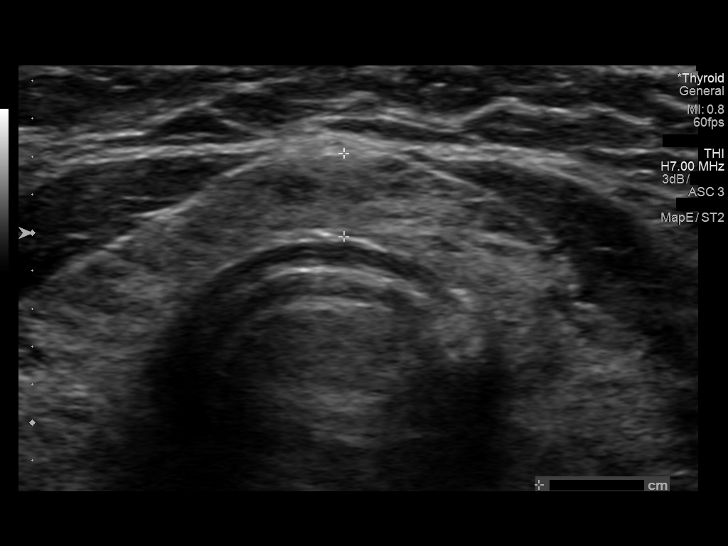
[im 4/48]
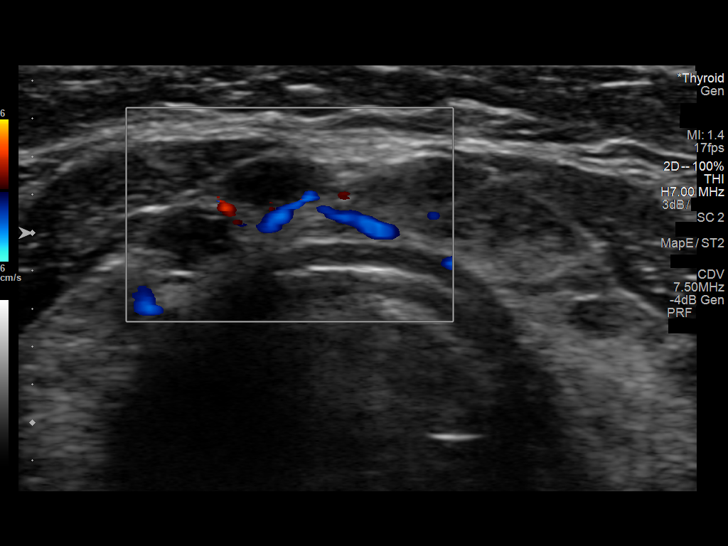
[im 8/48]
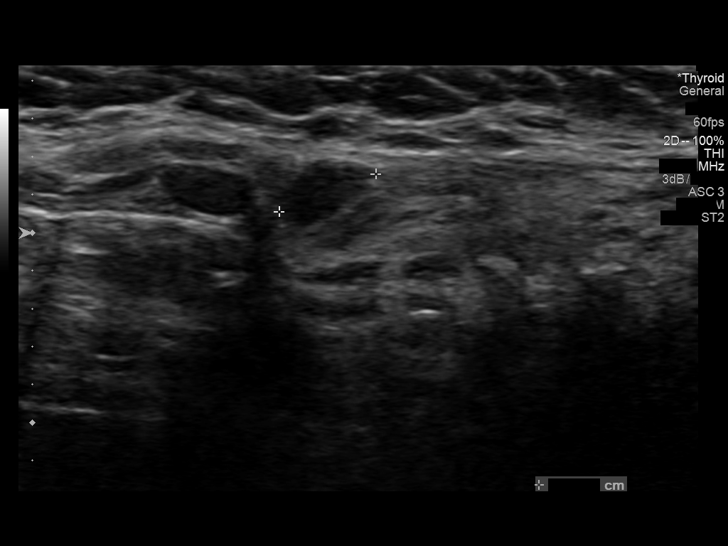
[im 12/48]
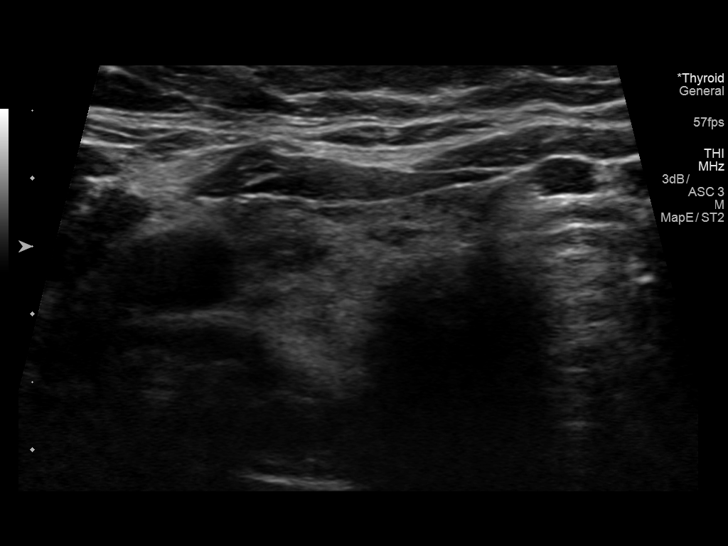
[im 16/48]
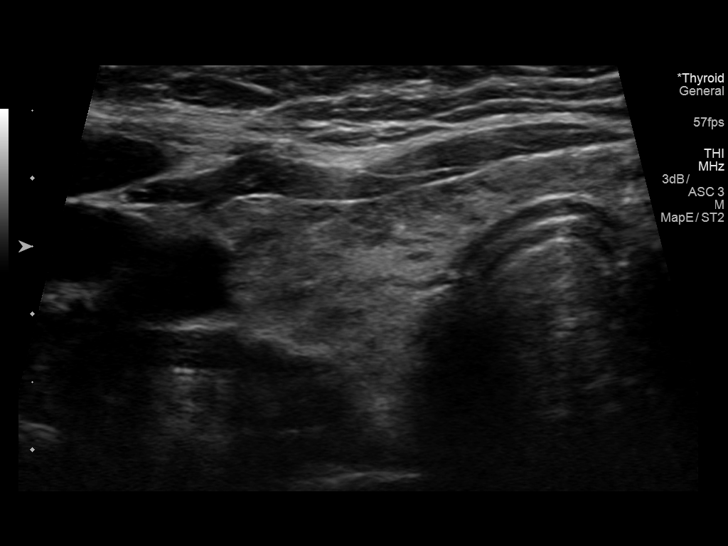
[im 18/48]
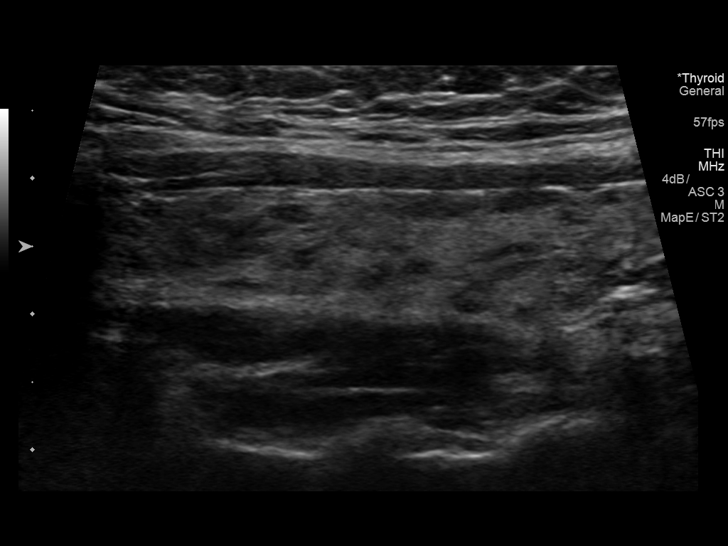
[im 22/48]
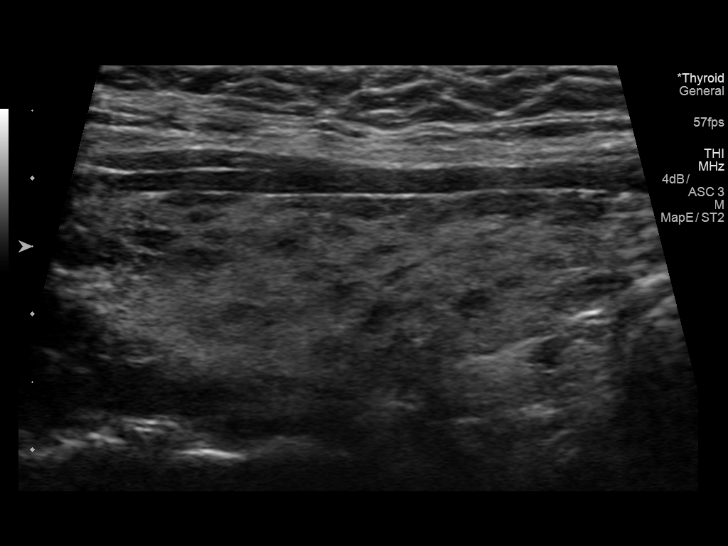
[im 26/48]
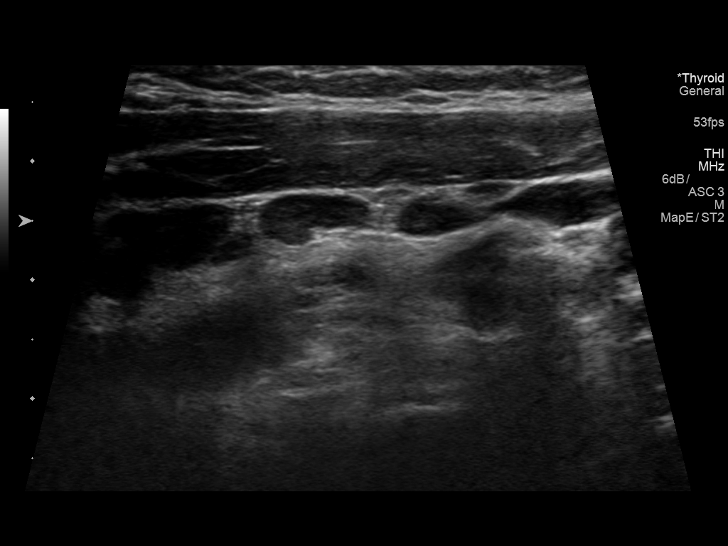
[im 30/48]
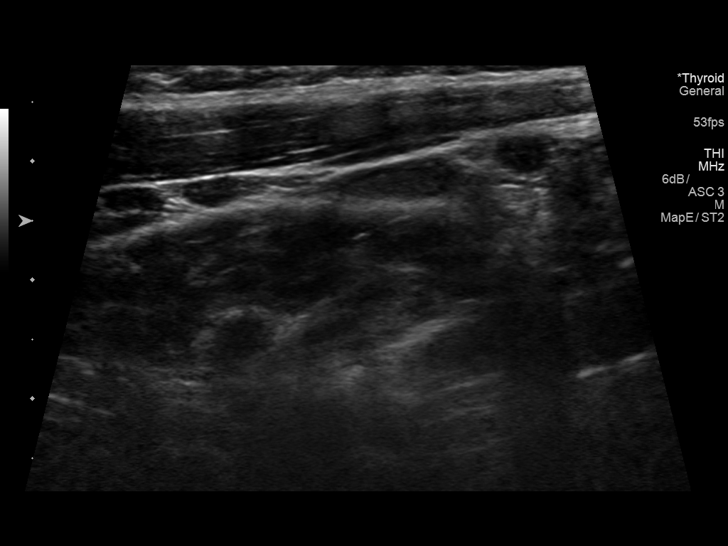
[im 32/48]
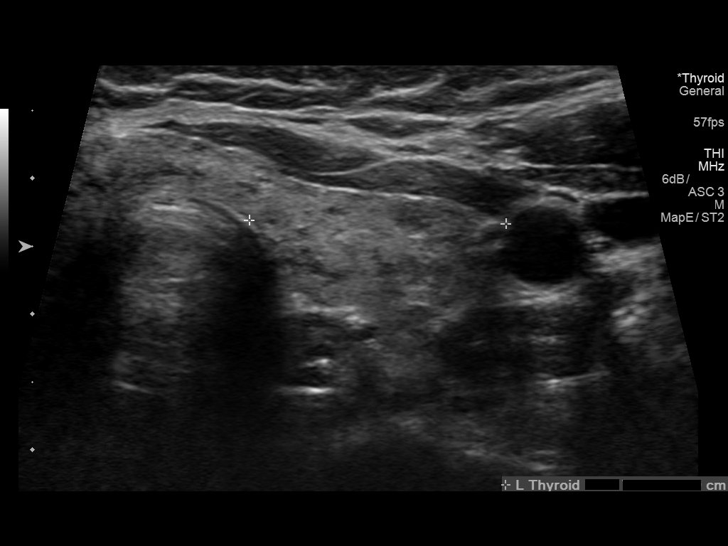
[im 36/48]
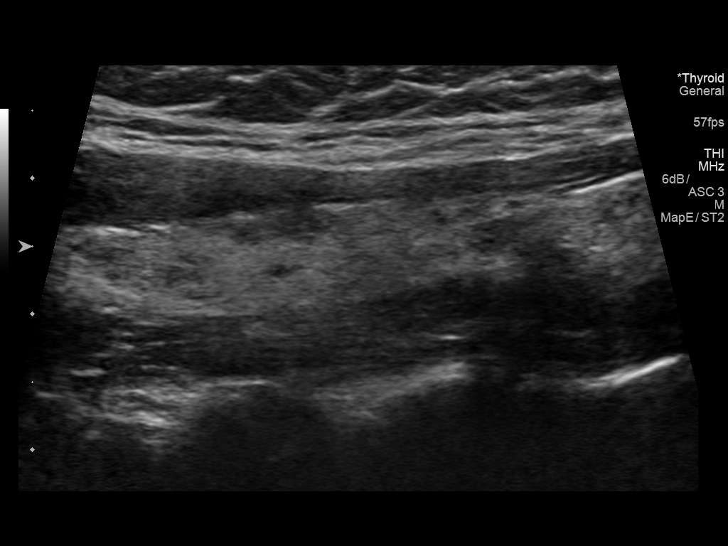
[im 40/48]
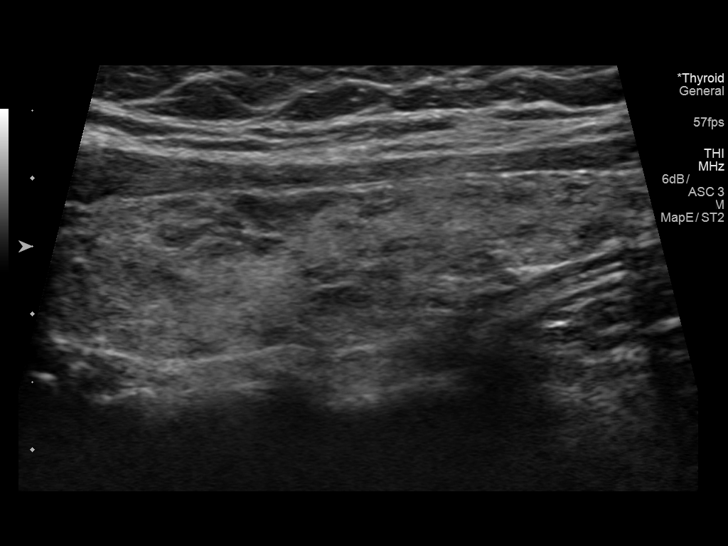
[im 44/48]
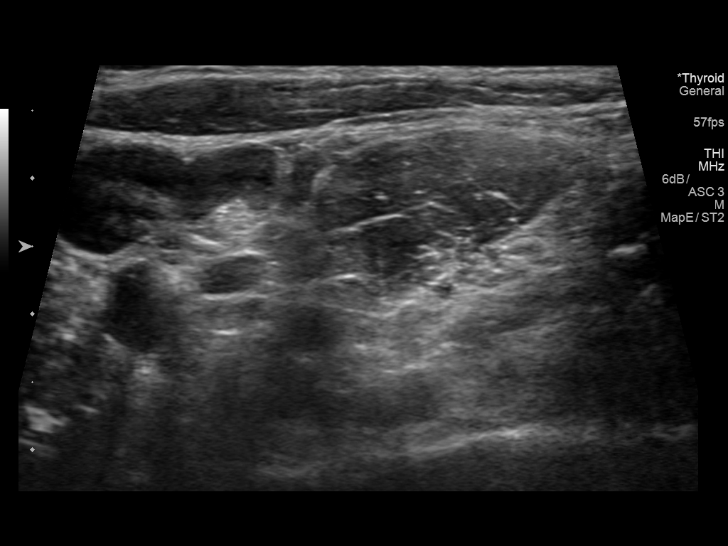
[im 48/48]
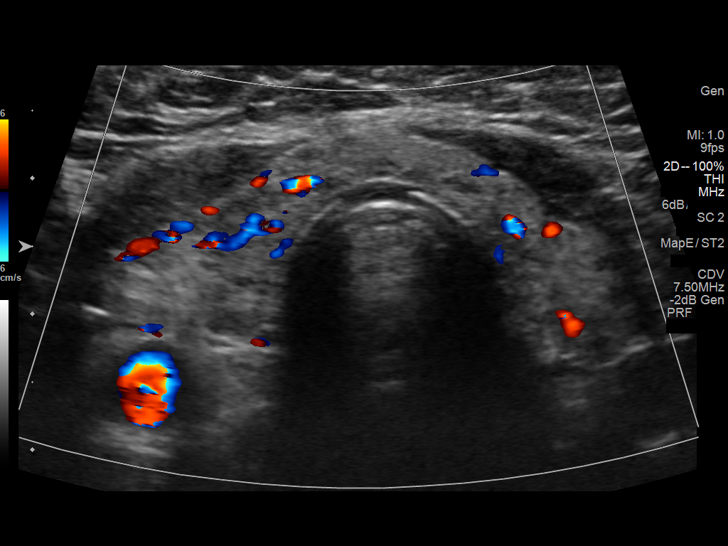

[14 of 25 positions shown; findings below may reference images not displayed]

FINDINGS: Parenchymal Echotexture: Moderately heterogenous

Isthmus: 0.4 cm thickness

Right lobe: 4.5 x 1.5 x 1.9 cm

Left lobe: 4.5 x 1.2 x 1.9 cm

_________________________________________________________

Estimated total number of nodules >/= 1 cm: 0

Number of spongiform nodules >/=  2 cm not described below (TR1): 0

Number of mixed cystic and solid nodules >/= 1.5 cm not described
below (TR2): 0

_________________________________________________________

0.7 cm hypoechoic nodule without calcifications in the isthmus,
right of midline. Adjacent 0.6 cm cyst.
IMPRESSION: 1. Normal-sized thyroid with small right isthmic lesions. Neither
meets criteria for biopsy or dedicated imaging follow-up.

The above is in keeping with the ACR TI-RADS recommendations - [HOSPITAL] 6364;[DATE].

## 2019-10-15 ENCOUNTER — Encounter: Payer: Self-pay | Admitting: Physician Assistant

## 2019-11-06 NOTE — Progress Notes (Signed)
Subjective:    Patient ID: Jasmine Espinoza, female    DOB: 04-21-1986, 34 y.o.   MRN: [id number]  HPI 34 y.o. WF with history of allergies on zyretc and flonase presents with cough x Sept. Barking cough, getting worse, some yellow mucus now. Will wake up in the middle of the night with it.  She has tried tylenol cold, mucinex but nothing has helped.  She has been having heart burn.  Has not been tested for COVID.   She has had some SOB with feeling she can not get a deep breath in, occ right after eating. No CP, fever, chills, no symptoms with exertion. Cough can be worse with lying.   Temperature 98 F (36.7 C), weight 215 lb (97.5 kg), last menstrual period 10/28/2019.  Medications    Current Outpatient Medications (Respiratory):  .  cetirizine (ZYRTEC) 5 MG tablet, Take 5 mg by mouth daily. .  fluticasone (FLONASE) 50 MCG/ACT nasal spray, Place 2 sprays into both nostrils daily.    Current Outpatient Medications (Other):  Marland Kitchen  Vitamin D, Ergocalciferol, (DRISDOL) 1.25 MG (50000 UT) CAPS capsule, 1 pill 3 days a week for 8 weeks for vitamin d deficiency  Problem list She has Postpartum depression and Obesity on their problem list.   Review of Systems  Constitutional: Negative for chills, fatigue and fever.  HENT: Negative for congestion, dental problem, ear discharge, ear pain, nosebleeds, rhinorrhea, sinus pressure, sore throat, trouble swallowing and voice change.   Respiratory: Positive for cough and wheezing. Negative for chest tightness and shortness of breath.   Cardiovascular: Negative.  Negative for leg swelling.  Gastrointestinal: Negative.  Negative for abdominal pain.  Genitourinary: Negative.   Musculoskeletal: Negative.   Neurological: Negative.        Objective:   Physical Exam General Appearance:Well sounding, in no apparent distress.  ENT/Mouth: No hoarseness, No cough for duration of visit.  Respiratory: completing full sentences without distress,  without audible wheeze Neuro: Awake and oriented X 3,  Psych:  Insight and Judgment appropriate.       Assessment & Plan:  Jasmine Espinoza was seen today for cough.  Diagnoses and all orders for this visit:  Cough -     Cancel: DG Chest 2 View; Future -     azithromycin (ZITHROMAX) 250 MG tablet; Take 2 tablets (500 mg) on  Day 1,  followed by 1 tablet (250 mg) once daily on Days 2 through 5. -     promethazine-dextromethorphan (PROMETHAZINE-DM) 6.25-15 MG/5ML syrup; Take 5 mLs by mouth 4 (four) times daily as needed for cough. -     benzonatate (TESSALON) 200 MG capsule; Take 1 capsule (200 mg total) by mouth 3 (three) times daily as needed for cough (Max: 600mg  per day). -     omeprazole (PRILOSEC) 40 MG capsule; Take 1 capsule (40 mg total) by mouth daily. -     DG Chest 2 View; Future   Cough- multifactorial-  get on PPI, may need to add H2/refer GI. Get on allergy pill, voice rest, suppress cough with OTC sugar free candy, will send in ABX.    Get CXR, get COVID testing  Follow up 1 month.   The patient was advised to call immediately if she has any concerning symptoms in the interval. The patient voices understanding of current treatment options and is in agreement with the current care plan.The patient knows to call the clinic with any problems, questions or concerns or go to the ER if  any further progression of symptoms.

## 2019-11-09 ENCOUNTER — Encounter: Payer: Self-pay | Admitting: Physician Assistant

## 2019-11-09 ENCOUNTER — Ambulatory Visit: Payer: Self-pay | Admitting: Physician Assistant

## 2019-11-09 ENCOUNTER — Other Ambulatory Visit: Payer: Self-pay

## 2019-11-09 ENCOUNTER — Ambulatory Visit
Admission: RE | Admit: 2019-11-09 | Discharge: 2019-11-09 | Disposition: A | Discharge status: Home / Self Care | Payer: BLUE CROSS/BLUE SHIELD | Source: Ambulatory Visit | Attending: Physician Assistant | Admitting: Physician Assistant

## 2019-11-09 VITALS — Temp 98.0°F | Wt 215.0 lb

## 2019-11-09 DIAGNOSIS — R05 Cough: Secondary | ICD-10-CM

## 2019-11-09 DIAGNOSIS — R059 Cough, unspecified: Secondary | ICD-10-CM

## 2019-11-09 MED ORDER — BENZONATATE 200 MG PO CAPS: 200.0000 mg | ORAL_CAPSULE | Freq: Three times a day (TID) | ORAL | 0 refills | Status: DC | PRN

## 2019-11-09 MED ORDER — AZITHROMYCIN 250 MG PO TABS: ORAL_TABLET | ORAL | 1 refills | Status: AC

## 2019-11-09 MED ORDER — PROMETHAZINE-DM 6.25-15 MG/5ML PO SYRP: 5.0000 mL | ORAL_SOLUTION | Freq: Four times a day (QID) | ORAL | 1 refills | Status: DC | PRN

## 2019-11-09 MED ORDER — OMEPRAZOLE 40 MG PO CPDR: 40.0000 mg | DELAYED_RELEASE_CAPSULE | Freq: Every day | ORAL | 1 refills | Status: DC

## 2019-11-09 NOTE — Patient Instructions (Addendum)
Silent reflux: Not all heartburn burns...Marland KitchenMarland KitchenMarland Kitchen  What is LPR? Laryngopharyngeal reflux (LPR) or silent reflux is a condition in which acid that is made in the stomach travels up the esophagus (swallowing tube) and gets to the throat. Not everyone with reflux has a lot of heartburn or indigestion. In fact, many people with LPR never have heartburn. This is why LPR is called SILENT REFLUX, and the terms "Silent reflux" and "LPR" are often used interchangeably. Because LPR is silent, it is sometimes difficult to diagnose.  How can you tell if you have LPR?  Marland Kitchen Chronic hoarseness- Some people have hoarseness that comes and goes . throat clearing  . Cough . It can cause shortness of breath and cause asthma like symptoms. Marland Kitchen a feeling of a lump in the throat  . difficulty swallowing . a problem with too much nose and throat drainage.  . Some people will feel their esophagus spasm which feels like their heart beating hard and fast, this will usually be after a meal, at rest, or lying down at night.    How do I treat this? Treatment for LPR should be individualized, and your doctor will suggest the best treatment for you. Generally there are several treatments for LPR: . changing habits and diet to reduce reflux,  . medications to reduce stomach acid, and  . surgery to prevent reflux. Most people with LPR need to modify how and when they eat, as well as take some medication, to get well. Sometimes, nonprescription liquid antacids, such as Maalox, Gelucil and Mylanta are recommended. When used, these antacids should be taken four times each day - one tablespoon one hour after each meal and before bedtime. Dietary and lifestyle changes alone are not often enough to control LPR - medications that reduce stomach acid are also usually needed. These must be prescribed by our doctor.   TIPS FOR REDUCING REFLUX AND LPR Control your LIFE-STYLE and your DIET! Marland Kitchen If you use tobacco, QUIT.  Marland Kitchen Smoking makes you  reflux. After every cigarette you have some LPR.  . Don't wear clothing that is too tight, especially around the waist (trousers, corsets, belts).  . Do not lie down just after eating...in fact, do not eat within three hours of bedtime.  . You should be on a low-fat diet.  . Limit your intake of red meat.  . Limit your intake of butter.  Marland Kitchen Avoid fried foods.  . Avoid chocolate  . Avoid cheese.  Marland Kitchen Avoid eggs. Marland Kitchen Specifically avoid caffeine (especially coffee and tea), soda pop (especially cola) and mints.  . Avoid alcoholic beverages, particularly in the evening.  INFORMATION ABOUT YOUR XRAY  Can walk into [address] building for an Personal assistant. They will have the order and take you back. You do not any paper work, I should get the result back today or tomorrow. This order is good for a year.  Can call [phone] to schedule an appointment if you wish.  The majority of colds are caused by viruses and do not require antibiotics. Please read the rest of this hand out to learn more about the common cold and what you can do to help yourself as well as help prevent the over use of antibiotics.   COMMON COLD SIGNS AND SYMPTOMS -- The common cold usually causes nasal congestion, runny nose, and sneezing. A sore throat may be present on the first day but usually resolves quickly. If a cough occurs, it generally develops on about the fourth  or fifth day of symptoms, and is always the last thing to go away.   COMMON COLD COMPLICATIONS -- In most cases, colds do not cause serious illness or complications. Most colds last for three to seven days, although many people continue to have symptoms (coughing, sneezing, congestion) for up to two weeks.  COMMON COLD TREATMENT -- There is no specific treatment for the viruses that cause the common cold. Most treatments are aimed at relieving some of the symptoms of the cold, but do not shorten or cure the cold.   Antibiotics are not useful for treating the common  cold; antibiotics are only used to treat illnesses caused by bacteria, not viruses. Unnecessary use of antibiotics for the treatment of the common cold can cause allergic reactions, diarrhea, or other gastrointestinal symptoms in some patients.   The symptoms of a cold will resolve over time, even without any treatment. People with underlying medical conditions and those who use other over-the-counter or prescription medications should speak with their healthcare provider or pharmacist to ensure that it is safe to use these treatments. The following are treatments that may reduce the symptoms caused by the common cold.  Nasal congestion -- Decongestants are good for nasal congestion- if you feel very stuffy but no mucus is coming out, this is the medication that will help you the most.  Pseudoephedrine is a decongestant that can improve nasal congestion. Although a prescription is not required, drugstores in the [place] keep pseudoephedrine behind the counter, so it must be requested from a pharmacist. If you have a heart condition or high blood pressure please use Coricidin BPH instead.   Runny nose - Antihistamines such as diphenhydramine (Benadryl), certazine (Zyrtec) which are best taking at night because they can make you tired OR loratadine (Claritin),  fexafinadine (Allegra) help with a runny nose.   Nasal sprays such an oxymetazoline (Afrin and others) may also give temporary relief of nasal congestion. However, these sprays should never be used for more than two to three days; use for more than three days use can worsen congestion.  Nasocort is now over the counter and can help decrease a runny nose. Please stop the medication if you have blurry vision or nose bleeds.   Sore throat and headache -- Sore throat and headache are best treated with a mild pain reliever such as acetaminophen (Tylenol) or a non-steroidal anti-inflammatory agent such as ibuprofen or naproxen (Motrin or Aleve). These  medications should be taken with food to prevent stomach problems. As well as gargling with warm water and salt.   Cough -- Common cough medicine ingredients include guaifenesin and dextromethorphan; these are often combined with other medications in over-the-counter cold formulas. Often a cough is worse at night or first in the morning due to post nasal drip from you nose. You can try to sleep at an angle to decrease a cough.   Alternative treatments -- Heated, humidified air can improve symptoms of nasal congestion and runny nose, and causes few to no side effects. A number of alternative products, including vitamin C, doubling up on your vitamin D and herbal products such as echinacea, may help. Certain products, such as nasal gels that contain zinc (eg, Zicam), have been associated with a permanent loss of smell.  Antibiotics -- Antibiotics should not be used to treat an uncomplicated common cold. Often you need to give your body 7-10 days to fight off a common cold while treating the symptoms with the medications listed above.  If after 7-10 days your symptoms are not improving, you are getting worse, you have shortness of breath, chest pain, a fever of over 103 you should seek medical help immediately.   PREVENTION IS THE BEST MEDICINE -- Hand washing is an essential and highly effective way to prevent the spread of infection.  Alcohol-based hand rubs are a good alternative for disinfecting hands if a sink is not available.  Hands should be washed before preparing food and eating and after coughing, blowing the nose, or sneezing. While it is not always possible to limit contact with people who may be infected with a cold, touching the eyes, nose, or mouth after direct contact should be avoided when possible. Sneezing/coughing into the sleeve of one's clothing (at the inner elbow) is another means of containing sprays of saliva and secretions and does not contaminate the hands.

## 2019-11-24 NOTE — Progress Notes (Signed)
Patient ID: Jasmine Espinoza, female   DOB: 10-Aug-1986, 34 y.o.   MRN: [id number] Complete Physical  Assessment and Plan: Depression, remission partial stress management techniques discussed, increase water, good sleep hygiene discussed, increase exercise, and increase veggies.   Abnormal glucose Continue weight loss   Vitamin D deficiency - Vit D  25 hydroxy (rtn osteoporosis monitoring)   Medication management - CBC with Differential/Platelet - BASIC METABOLIC PANEL WITH GFR - Hepatic function panel - Magnesium   Routine general medical examination at a health care facility  Screening for blood or protein in urine - Microalbumin / creatinine urine ratio - Urinalysis, Routine w reflex microscopic (not at [facility])  Morbid obesity (HCC) - increase veggies, decrease carbs - long discussion about weight loss, diet, and exercise  Seborrheic dermatitis of scalp Information given, cream given  Raynaud's phenomenon without gangrene wear warm clothes, if not better will add on CCB Will check labs for autoimmune with family history of lupus  Screening cholesterol level -     Lipid panel  Discussed med's effects and SE's. Screening labs and tests as requested with regular follow-up as recommended.  HPI  34 y.o. female  presents for a complete physical.   Her blood pressure has been controlled at home, today their BP is BP: 128/90 She does not workout regularly but would like to start exercising more.   She denies chest pain, shortness of breath, dizziness.  She has raynauds, states has been worse with the winter/cold.  Hands and feet and would like to get on medication for me.  Patient is on Vitamin D supplement, she was on 50,000 IU but not on OTC unknown dose.   Lab Results  Component Value Date   VD25OH 16 (L) 10/13/2018   BMI is Body mass index is 39.75 kg/m. Wt Readings from Last 3 Encounters:  11/25/19 228 lb (103.4 kg)  11/09/19 215 lb (97.5 kg)  10/13/18 200 lb  3.2 oz (90.8 kg)      Due to obesity, she has preDM, denies DM poly's.  Lab Results  Component Value Date   HGBA1C 5.3 08/01/2016   She has a 33 year old and a 33 year old, both girls, competitive dance, are in school daily at private school. She started new job, working from home, was working with her mom but they got into an argument 6-8 months ago.   She is on mirena which is helping    Current Medications:  Current Outpatient Medications on File Prior to Visit  Medication Sig Dispense Refill  . cetirizine (ZYRTEC) 5 MG tablet Take 5 mg by mouth daily.    . fluticasone (FLONASE) 50 MCG/ACT nasal spray Place 2 sprays into both nostrils daily. 16 g 0  . omeprazole (PRILOSEC) 40 MG capsule Take 1 capsule (40 mg total) by mouth daily. 30 capsule 1  . Vitamin D, Ergocalciferol, (DRISDOL) 1.25 MG (50000 UT) CAPS capsule 1 pill 3 days a week for 8 weeks for vitamin d deficiency 24 capsule 0   No current facility-administered medications on file prior to visit.   Health Maintenance:   Immunization History  Administered Date(s) Administered  . Influenza Split 08/01/2015  . Influenza-Unspecified 07/09/2016  . PPD Test 02/02/2015, 03/15/2015, 04/19/2016  . Tdap 04/24/2013   Tetanus: 2014 Pneumovax: N/A Flu vaccine: did not get this year Zostavax: N/A  LMP: Patient's last menstrual period was 11/18/2019.will have spotting Pap: 2018, follows Dr. Corinna Capra MGM:N/A  DEXA: Colonoscopy: N/A EGD:  Patient Care Team:  Lucky Cowboy, MD as PCP - General (Internal Medicine) Candice Camp, MD as Consulting Physician (Obstetrics and Gynecology)  Medical History:  Past Medical History:  Diagnosis Date  . Abnormal Pap smear   . Hx of blood clots 2015   on uterus during pregnancy  . Obesity (BMI 30-39.9)    BMI 38  . Postpartum depression 04/08/2014   Allergies No Known Allergies  SURGICAL HISTORY She  has a past surgical history that includes Wisdom tooth extraction and Colposcopy  (2014). FAMILY HISTORY Her family history includes Cancer in her maternal grandmother; Hearing loss in her paternal grandfather; Lupus in her paternal grandfather. SOCIAL HISTORY She  reports that she has never smoked. She has never used smokeless tobacco. She reports that she does not drink alcohol or use drugs.   Review of Systems  Constitutional: Negative for chills, diaphoresis, fever, malaise/fatigue and weight loss.  HENT: Negative.   Eyes: Negative.   Respiratory: Negative.  Negative for cough (better after being on GERD med) and shortness of breath.   Cardiovascular: Negative.  Negative for palpitations.  Gastrointestinal: Negative.   Genitourinary: Negative.   Musculoskeletal: Positive for joint pain (knee pain). Negative for back pain, falls, myalgias and neck pain.  Skin: Negative.   Neurological: Negative.  Negative for weakness.  Psychiatric/Behavioral: Negative.     Physical Exam: Estimated body mass index is 39.75 kg/m as calculated from the following:   Height as of this encounter: 5' 3.5" (1.613 m).   Weight as of this encounter: 228 lb (103.4 kg). BP 128/90   Pulse 65   Temp (!) 97.5 F (36.4 C)   Ht 5' 3.5" (1.613 m)   Wt 228 lb (103.4 kg)   LMP 11/18/2019 Comment: MORE SPOTTING THEN A FULL CYCLE  SpO2 96%   BMI 39.75 kg/m  General Appearance: Well nourished, in no apparent distress.  Eyes: PERRLA, EOMs, conjunctiva no swelling or erythema, normal fundi and vessels.  Sinuses: No Frontal/maxillary tenderness  ENT/Mouth: Ext aud canals clear, normal light reflex with TMs without erythema, bulging. Good dentition. No erythema, swelling, or exudate on post pharynx. Tonsils not swollen or erythematous. Hearing normal.  Neck: Supple, thyroid fullness, left posterior lymph node enlargement. No bruits  Respiratory: Respiratory effort normal, BS equal bilaterally without rales, rhonchi, wheezing or stridor.  Cardio: RRR without murmurs, rubs or gallops. Brisk  peripheral pulses without edema.  Chest: symmetric, with normal excursions and percussion.  Breasts:defer Abdomen: Soft, nontender, obese, no guarding, rebound, hernias, masses, or organomegaly. . Lymphatics: no enlargement, nontender Genitourinary: defer Musculoskeletal: Full ROM all peripheral extremities,5/5 strength, and normal gait.  Skin: seb dermatitis on hair/forehead. Warm, dry without rashes, lesions, ecchymosis. Neuro: Cranial nerves intact, reflexes equal bilaterally. Normal muscle tone, no cerebellar symptoms. Sensation intact.  Psych: Awake and oriented X 3, normal affect, Insight and Judgment appropriate.   EKG: defer   Quentin Mulling 3:41 PM [facility]

## 2019-11-25 ENCOUNTER — Other Ambulatory Visit: Payer: Self-pay

## 2019-11-25 ENCOUNTER — Ambulatory Visit (INDEPENDENT_AMBULATORY_CARE_PROVIDER_SITE_OTHER): Payer: PRIVATE HEALTH INSURANCE | Admitting: Physician Assistant

## 2019-11-25 ENCOUNTER — Encounter: Payer: Self-pay | Admitting: Physician Assistant

## 2019-11-25 VITALS — BP 128/90 | HR 65 | Temp 97.5°F | Ht 63.5 in | Wt 228.0 lb

## 2019-11-25 DIAGNOSIS — Z13220 Encounter for screening for lipoid disorders: Secondary | ICD-10-CM | POA: Diagnosis not present

## 2019-11-25 DIAGNOSIS — F53 Postpartum depression: Secondary | ICD-10-CM

## 2019-11-25 DIAGNOSIS — Z0000 Encounter for general adult medical examination without abnormal findings: Secondary | ICD-10-CM

## 2019-11-25 DIAGNOSIS — Z79899 Other long term (current) drug therapy: Secondary | ICD-10-CM

## 2019-11-25 DIAGNOSIS — Z0001 Encounter for general adult medical examination with abnormal findings: Secondary | ICD-10-CM

## 2019-11-25 DIAGNOSIS — D649 Anemia, unspecified: Secondary | ICD-10-CM

## 2019-11-25 DIAGNOSIS — Z1389 Encounter for screening for other disorder: Secondary | ICD-10-CM

## 2019-11-25 DIAGNOSIS — Z130 Encounter for screening for diseases of the blood and blood-forming organs and certain disorders involving the immune mechanism: Secondary | ICD-10-CM | POA: Diagnosis not present

## 2019-11-25 DIAGNOSIS — Z131 Encounter for screening for diabetes mellitus: Secondary | ICD-10-CM | POA: Diagnosis not present

## 2019-11-25 DIAGNOSIS — I7300 Raynaud's syndrome without gangrene: Secondary | ICD-10-CM | POA: Diagnosis not present

## 2019-11-25 DIAGNOSIS — E559 Vitamin D deficiency, unspecified: Secondary | ICD-10-CM

## 2019-11-25 DIAGNOSIS — L219 Seborrheic dermatitis, unspecified: Secondary | ICD-10-CM

## 2019-11-25 DIAGNOSIS — F3341 Major depressive disorder, recurrent, in partial remission: Secondary | ICD-10-CM

## 2019-11-25 DIAGNOSIS — R7309 Other abnormal glucose: Secondary | ICD-10-CM

## 2019-11-25 MED ORDER — KETOCONAZOLE 2 % EX CREA: 1.0000 "application " | TOPICAL_CREAM | Freq: Two times a day (BID) | CUTANEOUS | 1 refills | Status: DC

## 2019-11-25 NOTE — Patient Instructions (Signed)
General eating tips  What to Avoid . Avoid added sugars o Often added sugar can be found in processed foods such as many condiments, dry cereals, cakes, cookies, chips, crisps, crackers, candies, sweetened drinks, etc.  o Read labels and AVOID/DECREASE use of foods with the following in their ingredient list: Sugar, fructose, high fructose corn syrup, sucrose, glucose, maltose, dextrose, molasses, cane sugar, brown sugar, any type of syrup, agave nectar, etc.   . Avoid snacking in between meals- drink water or if you feel you need a snack, pick a high water content snack such as cucumbers, watermelon, or any veggie.  Marland Kitchen Avoid foods made with flour o If you are going to eat food made with flour, choose those made with whole-grains; and, minimize your consumption as much as is tolerable . Avoid processed foods o These foods are generally stocked in the middle of the grocery store.  o Focus on shopping on the perimeter of the grocery.  What to Include . Vegetables o GREEN LEAFY VEGETABLES: Kale, spinach, mustard greens, collard greens, cabbage, broccoli, etc. o OTHER: Asparagus, cauliflower, eggplant, carrots, peas, Brussel sprouts, tomatoes, bell peppers, zucchini, beets, cucumbers, etc. . Grains, seeds, and legumes o Beans: kidney beans, black eyed peas, garbanzo beans, black beans, pinto beans, etc. o Whole, unrefined grains: brown rice, barley, bulgur, oatmeal, etc. . Healthy fats  o Avoid highly processed fats such as vegetable oil o Examples of healthy fats: avocado, olives, virgin olive oil, dark chocolate (?72% Cocoa), nuts (peanuts, almonds, walnuts, cashews, pecans, etc.) o Please still do small amount of these healthy fats, they are dense in calories.  . Low - Moderate Intake of Animal Sources of Protein o Meat sources: chicken, [place], salmon, tuna. Limit to 4 ounces of meat at one time or the size of your palm. o Consider limiting dairy sources, but when choosing dairy focus on:  PLAIN [place] yogurt, cottage cheese, high-protein milk . Fruit o Choose berries    Seborrheic Dermatitis, Adult Seborrheic dermatitis is a skin disease that causes red, scaly patches. It usually occurs on the scalp, and it is often called dandruff. The patches may appear on other parts of the body. Skin patches tend to appear where there are many oil glands in the skin. Areas of the body that are commonly affected include:  Scalp.  Skin folds of the body.  Ears.  Eyebrows.  Neck.  Face.  Armpits.  The bearded area of men's faces. The condition may come and go for no known reason, and it is often long-lasting (chronic). What are the causes? The cause of this condition is not known. What increases the risk? This condition is more likely to develop in people who:  Have certain conditions, such as: ? HIV (human immunodeficiency virus). ? AIDS (acquired immunodeficiency syndrome). ? Parkinson disease. ? Mood disorders, such as depression.  Are 59-46 years old. What are the signs or symptoms? Symptoms of this condition include:  Thick scales on the scalp.  Redness on the face or in the armpits.  Skin that is flaky. The flakes may be white or yellow.  Skin that seems oily or dry but is not helped with moisturizers.  Itching or burning in the affected areas. How is this diagnosed? This condition is diagnosed with a medical history and physical exam. A sample of your skin may be tested (skin biopsy). You may need to see a skin specialist (dermatologist). How is this treated? There is no cure for this condition, but treatment  can help to manage the symptoms. You may get treatment to remove scales, lower the risk of skin infection, and reduce swelling or itching. Treatment may include:  Creams that reduce swelling and irritation (steroids).  Creams that reduce skin yeast.  Medicated shampoo, soaps, moisturizing creams, or ointments.  Medicated moisturizing creams or  ointments. Follow these instructions at home:  Apply over-the-counter and prescription medicines only as told by your health care provider.  Use any medicated shampoo, soaps, skin creams, or ointments only as told by your health care provider.  Keep all follow-up visits as told by your health care provider. This is important. Contact a health care provider if:  Your symptoms do not improve with treatment.  Your symptoms get worse.  You have new symptoms. This information is not intended to replace advice given to you by your health care provider. Make sure you discuss any questions you have with your health care provider. Document Revised: 09/06/2017 Document Reviewed: 01/12/2016 Elsevier Patient Education  [place].

## 2019-11-27 ENCOUNTER — Other Ambulatory Visit: Payer: Self-pay | Admitting: Physician Assistant

## 2019-11-27 DIAGNOSIS — I7300 Raynaud's syndrome without gangrene: Secondary | ICD-10-CM

## 2019-11-27 DIAGNOSIS — Z8269 Family history of other diseases of the musculoskeletal system and connective tissue: Secondary | ICD-10-CM

## 2019-11-27 DIAGNOSIS — R768 Other specified abnormal immunological findings in serum: Secondary | ICD-10-CM

## 2019-11-27 LAB — VITAMIN B12: Vitamin B-12: 419 pg/mL (ref 200–1100)

## 2019-11-27 LAB — COMPLETE METABOLIC PANEL WITH GFR
AG Ratio: 1.3 (calc) (ref 1.0–2.5)
ALT: 11 U/L (ref 6–29)
AST: 12 U/L (ref 10–30)
Albumin: 4.3 g/dL (ref 3.6–5.1)
Alkaline phosphatase (APISO): 92 U/L (ref 31–125)
BUN: 9 mg/dL (ref 7–25)
CO2: 25 mmol/L (ref 20–32)
Calcium: 9.3 mg/dL (ref 8.6–10.2)
Chloride: 103 mmol/L (ref 98–110)
Creat: 0.62 mg/dL (ref 0.50–1.10)
GFR, Est African American: 138 mL/min/{1.73_m2} (ref >=60)
GFR, Est Non African American: 119 mL/min/{1.73_m2} (ref >=60)
Globulin: 3.3 g/dL (calc) (ref 1.9–3.7)
Glucose, Bld: 76 mg/dL (ref 65–99)
Potassium: 4.2 mmol/L (ref 3.5–5.3)
Sodium: 137 mmol/L (ref 135–146)
Total Bilirubin: 0.2 mg/dL (ref 0.2–1.2)
Total Protein: 7.6 g/dL (ref 6.1–8.1)

## 2019-11-27 LAB — LIPID PANEL
Cholesterol: 114 mg/dL (ref <200)
HDL: 34 mg/dL — ABNORMAL LOW (ref >=50)
LDL Cholesterol (Calc): 64 mg/dL (calc)
Non-HDL Cholesterol (Calc): 80 mg/dL (calc) (ref <130)
Total CHOL/HDL Ratio: 3.4 (calc) (ref <5.0)
Triglycerides: 82 mg/dL (ref <150)

## 2019-11-27 LAB — HEMOGLOBIN A1C
Hgb A1c MFr Bld: 5.3 % of total Hgb (ref <5.7)
Mean Plasma Glucose: 105 (calc)
eAG (mmol/L): 5.8 (calc)

## 2019-11-27 LAB — CBC WITH DIFFERENTIAL/PLATELET
Absolute Monocytes: 518 cells/uL (ref 200–950)
Basophils Absolute: 18 cells/uL (ref 0–200)
Basophils Relative: 0.4 %
Eosinophils Absolute: 59 cells/uL (ref 15–500)
Eosinophils Relative: 1.3 %
HCT: 42.5 % (ref 35.0–45.0)
Hemoglobin: 13.7 g/dL (ref 11.7–15.5)
Lymphs Abs: 1274 cells/uL (ref 850–3900)
MCH: 27 pg (ref 27.0–33.0)
MCHC: 32.2 g/dL (ref 32.0–36.0)
MCV: 83.8 fL (ref 80.0–100.0)
MPV: 10.2 fL (ref 7.5–12.5)
Monocytes Relative: 11.5 %
Neutro Abs: 2633 cells/uL (ref 1500–7800)
Neutrophils Relative %: 58.5 %
Platelets: 280 10*3/uL (ref 140–400)
RBC: 5.07 10*6/uL (ref 3.80–5.10)
RDW: 13.4 % (ref 11.0–15.0)
Total Lymphocyte: 28.3 %
WBC: 4.5 10*3/uL (ref 3.8–10.8)

## 2019-11-27 LAB — URINALYSIS, ROUTINE W REFLEX MICROSCOPIC
Bacteria, UA: NONE SEEN /HPF
Bilirubin Urine: NEGATIVE
Glucose, UA: NEGATIVE
Hgb urine dipstick: NEGATIVE
Hyaline Cast: NONE SEEN /LPF
Ketones, ur: NEGATIVE
Nitrite: NEGATIVE
Protein, ur: NEGATIVE
Specific Gravity, Urine: 1.024 (ref 1.001–1.03)
pH: 6.5 (ref 5.0–8.0)

## 2019-11-27 LAB — ANTI-DNA ANTIBODY, DOUBLE-STRANDED: ds DNA Ab: 4 IU/mL

## 2019-11-27 LAB — MICROALBUMIN / CREATININE URINE RATIO
Creatinine, Urine: 144 mg/dL (ref 20–275)
Microalb Creat Ratio: 6 mcg/mg creat (ref <30)
Microalb, Ur: 0.9 mg/dL

## 2019-11-27 LAB — ANA: Anti Nuclear Antibody (ANA): POSITIVE — AB

## 2019-11-27 LAB — ANTI-NUCLEAR AB-TITER (ANA TITER): ANA Titer 1: 1:1280 {titer} — ABNORMAL HIGH

## 2019-11-27 LAB — RNP ANTIBODY: Ribonucleic Protein(ENA) Antibody, IgG: >8 AI — AB

## 2019-11-27 LAB — IRON, TOTAL/TOTAL IRON BINDING CAP
%SAT: 11 % (calc) — ABNORMAL LOW (ref 16–45)
Iron: 37 ug/dL — ABNORMAL LOW (ref 40–190)
TIBC: 346 mcg/dL (calc) (ref 250–450)

## 2019-11-27 LAB — ANTI-SMITH ANTIBODY: ENA SM Ab Ser-aCnc: 1.1 AI — AB

## 2019-11-27 LAB — VITAMIN D 25 HYDROXY (VIT D DEFICIENCY, FRACTURES): Vit D, 25-Hydroxy: 24 ng/mL — ABNORMAL LOW (ref 30–100)

## 2019-11-27 LAB — MAGNESIUM: Magnesium: 2 mg/dL (ref 1.5–2.5)

## 2019-11-27 LAB — TSH: TSH: 2.39 mIU/L

## 2019-12-01 ENCOUNTER — Other Ambulatory Visit: Payer: Self-pay | Admitting: Physician Assistant

## 2019-12-01 DIAGNOSIS — R059 Cough, unspecified: Secondary | ICD-10-CM

## 2019-12-01 DIAGNOSIS — R05 Cough: Secondary | ICD-10-CM

## 2020-01-04 NOTE — Progress Notes (Signed)
Office Visit Note  Patient: Jasmine Espinoza             Date of Birth: 09-25-1986           MRN: [id number]             PCP: Unk Pinto, MD Referring: Vicie Mutters, PA-C Visit Date: 01/11/2020 Occupation: '@GUAROCC' @  Subjective:  New Patient (Initial Visit) (Abnormal labs, bilateral knees, swollen lympnodes x 2 years, reflux, chest pain with deep breathes, )   History of Present Illness: Jasmine Espinoza is a 34 y.o. female seen in consultation per request of her PCP.  According to patient in 2013 while she was pregnant she had a uterine bleed and she was on bedrest.  She developed a DVT at the time.  She states her pregnancy was uneventful she developed postpartum depression after the pregnancy which resolved.  She states her dentist noticed some cervical lymph nodes and she was evaluated by her PCP.  She had an ultrasound which was negative.  In 2016 she started experiencing Raynaud's in her hands and her feet.  She states she was also experiencing paresthesias.  The Raynaud's symptoms persist.  She states she has Raynaud's almost every day.  In the last 2 years she has been experiencing pain in her bilateral knee joints.  In the last 6 to 9 months she has pain and swelling in her bilateral hands and her feet.  She states symptoms are more prominent in her right hand.  She also has severe reflux and she believes she has some shortness of breath.  She had a chest x-ray recently which was normal.  She had some labs done by her PCP which showed positive ANA and she was referred to me for further evaluation.  Patient gives history of malar rash, photosensitivity and fatigue.  She denies any history of oral ulcers, nasal ulcers, sicca symptoms or hair loss.  There is family history of systemic lupus in her paternal grandmother.  Activities of Daily Living:  Patient reports morning stiffness for 2 hours.   Patient Denies nocturnal pain.  Difficulty dressing/grooming: Denies Difficulty  climbing stairs: Denies Difficulty getting out of chair: Denies Difficulty using hands for taps, buttons, cutlery, and/or writing: Denies  Review of Systems  Constitutional: Positive for fatigue. Negative for night sweats, weight gain and weight loss.  HENT: Negative for mouth sores, trouble swallowing, trouble swallowing, mouth dryness and nose dryness.   Eyes: Negative for pain, redness, visual disturbance and dryness.  Respiratory: Positive for shortness of breath. Negative for cough and difficulty breathing.   Cardiovascular: Negative for chest pain, palpitations, hypertension, irregular heartbeat and swelling in legs/feet.  Gastrointestinal: Positive for heartburn. Negative for blood in stool, constipation and diarrhea.  Endocrine: Positive for cold intolerance. Negative for increased urination.  Genitourinary: Negative for difficulty urinating and vaginal dryness.  Musculoskeletal: Positive for arthralgias, joint pain, joint swelling and morning stiffness. Negative for myalgias, muscle weakness, muscle tenderness and myalgias.  Skin: Positive for color change, rash and sensitivity to sunlight. Negative for hair loss, skin tightness and ulcers.  Allergic/Immunologic: Negative for susceptible to infections.  Neurological: Positive for numbness and headaches. Negative for dizziness, memory loss, night sweats and weakness.  Hematological: Positive for swollen glands.  Psychiatric/Behavioral: Negative for depressed mood and sleep disturbance. The patient is not nervous/anxious.     PMFS History:  Patient Active Problem List   Diagnosis Date Noted  . Obesity 07/27/2014  . Postpartum depression 04/08/2014  Past Medical History:  Diagnosis Date  . Abnormal Pap smear   . Hx of blood clots 2015   on uterus during pregnancy  . Obesity (BMI 30-39.9)    BMI 38  . Postpartum depression 04/08/2014    Family History  Problem Relation Age of Onset  . Cancer Maternal Grandmother         breast, late 35s  . Heart disease Paternal Grandfather        does not know him, died when she was27  . Lupus Paternal Grandmother    Past Surgical History:  Procedure Laterality Date  . COLPOSCOPY  2014  . WISDOM TOOTH EXTRACTION     Social History   Social History Narrative  . Not on file   Immunization History  Administered Date(s) Administered  . Influenza Split 08/01/2015  . Influenza-Unspecified 07/09/2016  . PPD Test 02/02/2015, 03/15/2015, 04/19/2016  . Tdap 04/24/2013     Objective: Vital Signs: BP 127/76 (BP Location: Right Arm, Patient Position: Sitting, Cuff Size: Normal)   Pulse 95   Resp 14   Ht '5\' 3"'  (1.6 m)   Wt 226 lb 9.6 oz (102.8 kg)   BMI 40.14 kg/m    Physical Exam Vitals and nursing note reviewed.  Constitutional:      Appearance: She is well-developed.  HENT:     Head: Normocephalic and atraumatic.  Eyes:     Conjunctiva/sclera: Conjunctivae normal.  Cardiovascular:     Rate and Rhythm: Normal rate and regular rhythm.     Heart sounds: Normal heart sounds.  Pulmonary:     Effort: Pulmonary effort is normal.     Breath sounds: Normal breath sounds.  Abdominal:     General: Bowel sounds are normal.     Palpations: Abdomen is soft.  Musculoskeletal:     Cervical back: Normal range of motion.  Lymphadenopathy:     Cervical: No cervical adenopathy.  Skin:    General: Skin is warm and dry.     Capillary Refill: Capillary refill takes 2 to 3 seconds.     Comments: Malar rash noted  Neurological:     Mental Status: She is alert and oriented to person, place, and time.  Psychiatric:        Behavior: Behavior normal.      Musculoskeletal Exam: C-spine thoracic and lumbar spine with good range of motion.  Shoulder joints, elbow joints, wrist joints, MCPs PIPs and DIPs with good range of motion with no synovitis.  Hip joints, knee joints, ankles, MTPs and PIPs with good range of motion with no synovitis.  CDAI Exam: CDAI Score: -- Patient  Global: --; Provider Global: -- Swollen: --; Tender: -- Joint Exam 01/11/2020   No joint exam has been documented for this visit   There is currently no information documented on the homunculus. Go to the Rheumatology activity and complete the homunculus joint exam.  Investigation: No additional findings.  Imaging: XR Foot 2 Views Left  Result Date: 01/11/2020 No MCP, PIP or DIP narrowing was noted.  No intertarsal tibiotalar joint space narrowing was noted.  A calcaneal spur was noted.  No erosive changes were noted. Impression: Unremarkable x-ray of the foot except for calcaneal spur.  XR Foot 2 Views Right  Result Date: 01/11/2020 No MCP, PIP or DIP narrowing was noted.  No intertarsal tibiotalar joint space narrowing was noted.  A calcaneal spur was noted.  No erosive changes were noted. Impression: Unremarkable x-ray of the foot except for calcaneal  spur.  XR Hand 2 View Left  Result Date: 01/11/2020 No MCP, PIP or DIP narrowing was noted.  No intercarpal radiocarpal joint space narrowing was noted.  No erosive changes were noted. Impression: Unremarkable x-ray of the hand.  XR Hand 2 View Right  Result Date: 01/11/2020 No MCP, PIP or DIP narrowing was noted.  No intercarpal radiocarpal joint space narrowing was noted.  No erosive changes were noted. Impression: Unremarkable x-ray of the hand.  XR KNEE 3 VIEW LEFT  Result Date: 01/11/2020 No joint space narrowing was noted.  No patellofemoral narrowing was noted.  No chondrocalcinosis was noted. Impression: Unremarkable x-ray of the knee joint.  XR KNEE 3 VIEW RIGHT  Result Date: 01/11/2020 No joint space narrowing was noted.  No patellofemoral narrowing was noted.  No chondrocalcinosis was noted. Impression: Unremarkable x-ray of the knee joint.   Recent Labs: Lab Results  Component Value Date   WBC 4.5 11/25/2019   HGB 13.7 11/25/2019   PLT 280 11/25/2019   NA 137 11/25/2019   K 4.2 11/25/2019   CL 103 11/25/2019   CO2  25 11/25/2019   GLUCOSE 76 11/25/2019   BUN 9 11/25/2019   CREATININE 0.62 11/25/2019   BILITOT 0.2 11/25/2019   ALKPHOS 91 08/01/2016   AST 12 11/25/2019   ALT 11 11/25/2019   PROT 7.6 11/25/2019   ALBUMIN 3.9 08/01/2016   CALCIUM 9.3 11/25/2019   GFRAA 138 11/25/2019    Speciality Comments: No specialty comments available.  Procedures:  No procedures performed Allergies: Patient has no known allergies.   Assessment / Plan:     Visit Diagnoses: Other organ or system involvement in systemic lupus erythematosus ([place]) -history of fatigue, joint swelling, malar rash, photosensitivity, positive ANA, positive Smith, positive RNP, positive family history of systemic lupus.  History of DVT during pregnancy.  Plan: Sedimentation rate, Anti-scleroderma antibody, Sjogrens syndrome-A extractable nuclear antibody, Sjogrens syndrome-B extractable nuclear antibody, C3 and C4, Beta-2 glycoprotein antibodies, Cardiolipin antibodies, IgG, IgM, IgA, Lupus Anticoagulant Eval w/Reflex.  We had detailed discussion regarding different treatment options and the side effects.  After discussing indications side effects contraindications we decided to proceed with Plaquenil.  Handout was given and consent was taken.  She will be placed on Plaquenil 200 mg p.o. twice daily.  Once her symptoms are better controlled we can reduce the dose to 200 mg twice daily Monday to Friday.  Raynaud's syndrome without gangrene-she gives history of ongoing Raynaud's for the last few years.  She had decreased capillary refill.  I plan to add Norvasc at the follow-up visit once she gets used to Plaquenil.  Anti-Smith (anti-Sm) antibody and antiribonuclear protein (anti-RNP) antibody positive - 11/25/19: ANA 1:1280 NS, Smith 1.1, RNP >8.0, dsDNA 4, vitamin B12 419, vit D 24, Mg 2.0, TSH 2.39  Pain in both hands -she complains of pain and swelling in her bilateral hands.  Plan: XR Hand 2 View Right, XR Hand 2 View Left,.  X-rays are  within normal limits.  Rheumatoid factor  Chronic pain of both knees -she complains of pain and discomfort in the bilateral knee joints.  Plan: XR KNEE 3 VIEW RIGHT, XR KNEE 3 VIEW LEFT.  The x-rays are within normal limits.  Pain in both feet -she complains of pain and swelling in her bilateral feet.  Plan: XR Foot 2 Views Right, XR Foot 2 Views Left.  X-rays were unremarkable except for calcaneal spurs.  History of blood clots - DVT 2013, 2014 during prenancy  Other fatigue - Plan: CK  High risk medication use - Plan: CBC with Differential/Platelet, CMP14+EGFR, Glucose 6 phosphate dehydrogenase, Thiopurine methyltransferase(tpmt)rbc, Hepatitis B core antibody, IgM, Hepatitis B surface antigen, Hepatitis C antibody, HIV Antibody (routine testing w rflx), QuantiFERON-TB Gold Plus, Serum protein electrophoresis with reflex, IgG, IgA, IgM  Shortness of breath-patient gives history of shortness of breath.  Her chest x-ray was normal.  I will make a pulmonary referral for evaluation as patient has positive RNP.  Family history of systemic lupus erythematosus-in her paternal grandmother.  Seasonal allergies  History of gastroesophageal reflux (GERD)  Vitamin D deficiency-she is on vitamin D.  Orders: Orders Placed This Encounter  Procedures  . XR Hand 2 View Right  . XR Hand 2 View Left  . XR KNEE 3 VIEW RIGHT  . XR KNEE 3 VIEW LEFT  . XR Foot 2 Views Right  . XR Foot 2 Views Left  . CBC with Differential/Platelet  . CMP14+EGFR  . CK  . Sedimentation rate  . Rheumatoid factor  . Anti-scleroderma antibody  . Sjogrens syndrome-A extractable nuclear antibody  . Sjogrens syndrome-B extractable nuclear antibody  . C3 and C4  . Beta-2 glycoprotein antibodies  . Cardiolipin antibodies, IgG, IgM, IgA  . Lupus Anticoagulant Eval w/Reflex  . Glucose 6 phosphate dehydrogenase  . Thiopurine methyltransferase(tpmt)rbc  . Hepatitis B core antibody, IgM  . Hepatitis B surface antigen  .  Hepatitis C antibody  . HIV Antibody (routine testing w rflx)  . QuantiFERON-TB Gold Plus  . Serum protein electrophoresis with reflex  . IgG, IgA, IgM  . Ambulatory referral to Pulmonology   Meds ordered this encounter  Medications  . hydroxychloroquine (PLAQUENIL) 200 MG tablet    Sig: Take 1 tablet (200 mg total) by mouth 2 (two) times daily.    Dispense:  180 tablet    Refill:  0    Face-to-face time spent with patient was 50 minutes. Greater than 50% of time was spent in counseling and coordination of care.  Follow-Up Instructions: Return for Systemic lupus.   Bo Merino, MD  Note - This record has been created using Editor, commissioning.  Chart creation errors have been sought, but may not always  have been located. Such creation errors do not reflect on  the standard of medical care.

## 2020-01-11 ENCOUNTER — Ambulatory Visit: Payer: Self-pay

## 2020-01-11 ENCOUNTER — Encounter: Payer: Self-pay | Admitting: Rheumatology

## 2020-01-11 ENCOUNTER — Other Ambulatory Visit: Payer: Self-pay

## 2020-01-11 ENCOUNTER — Ambulatory Visit (INDEPENDENT_AMBULATORY_CARE_PROVIDER_SITE_OTHER): Payer: Managed Care, Other (non HMO) | Admitting: Rheumatology

## 2020-01-11 VITALS — BP 127/76 | HR 95 | Resp 14 | Ht 63.0 in | Wt 226.6 lb

## 2020-01-11 DIAGNOSIS — Z8719 Personal history of other diseases of the digestive system: Secondary | ICD-10-CM

## 2020-01-11 DIAGNOSIS — M3219 Other organ or system involvement in systemic lupus erythematosus: Secondary | ICD-10-CM

## 2020-01-11 DIAGNOSIS — G8929 Other chronic pain: Secondary | ICD-10-CM

## 2020-01-11 DIAGNOSIS — E559 Vitamin D deficiency, unspecified: Secondary | ICD-10-CM

## 2020-01-11 DIAGNOSIS — Z79899 Other long term (current) drug therapy: Secondary | ICD-10-CM

## 2020-01-11 DIAGNOSIS — Z8269 Family history of other diseases of the musculoskeletal system and connective tissue: Secondary | ICD-10-CM

## 2020-01-11 DIAGNOSIS — M25561 Pain in right knee: Secondary | ICD-10-CM

## 2020-01-11 DIAGNOSIS — R768 Other specified abnormal immunological findings in serum: Secondary | ICD-10-CM

## 2020-01-11 DIAGNOSIS — J302 Other seasonal allergic rhinitis: Secondary | ICD-10-CM

## 2020-01-11 DIAGNOSIS — I7300 Raynaud's syndrome without gangrene: Secondary | ICD-10-CM

## 2020-01-11 DIAGNOSIS — M79642 Pain in left hand: Secondary | ICD-10-CM

## 2020-01-11 DIAGNOSIS — M25562 Pain in left knee: Secondary | ICD-10-CM

## 2020-01-11 DIAGNOSIS — M79641 Pain in right hand: Secondary | ICD-10-CM

## 2020-01-11 DIAGNOSIS — R5383 Other fatigue: Secondary | ICD-10-CM

## 2020-01-11 DIAGNOSIS — M79672 Pain in left foot: Secondary | ICD-10-CM

## 2020-01-11 DIAGNOSIS — M79671 Pain in right foot: Secondary | ICD-10-CM

## 2020-01-11 DIAGNOSIS — R0602 Shortness of breath: Secondary | ICD-10-CM

## 2020-01-11 DIAGNOSIS — Z86718 Personal history of other venous thrombosis and embolism: Secondary | ICD-10-CM

## 2020-01-11 MED ORDER — HYDROXYCHLOROQUINE SULFATE 200 MG PO TABS: 200.0000 mg | ORAL_TABLET | Freq: Two times a day (BID) | ORAL | 0 refills | Status: DC

## 2020-01-11 NOTE — Progress Notes (Signed)
Pharmacy Note  Subjective: Patient presents today to [facility] for follow up office visit.   Patient seen by the pharmacist for counseling on hydroxychloroquine systemic lupus erythematosus.  She is naive to therapy.  Objective: CMP     Component Value Date/Time   NA 137 11/25/2019 1608   K 4.2 11/25/2019 1608   CL 103 11/25/2019 1608   CO2 25 11/25/2019 1608   GLUCOSE 76 11/25/2019 1608   BUN 9 11/25/2019 1608   CREATININE 0.62 11/25/2019 1608   CALCIUM 9.3 11/25/2019 1608   PROT 7.6 11/25/2019 1608   ALBUMIN 3.9 08/01/2016 1431   AST 12 11/25/2019 1608   ALT 11 11/25/2019 1608   ALKPHOS 91 08/01/2016 1431   BILITOT 0.2 11/25/2019 1608   GFRNONAA 119 11/25/2019 1608   GFRAA 138 11/25/2019 1608    CBC    Component Value Date/Time   WBC 4.5 11/25/2019 1608   RBC 5.07 11/25/2019 1608   HGB 13.7 11/25/2019 1608   HCT 42.5 11/25/2019 1608   PLT 280 11/25/2019 1608   MCV 83.8 11/25/2019 1608   MCH 27.0 11/25/2019 1608   MCHC 32.2 11/25/2019 1608   RDW 13.4 11/25/2019 1608   LYMPHSABS 1,274 11/25/2019 1608   MONOABS 504 08/01/2016 1431   EOSABS 59 11/25/2019 1608   BASOSABS 18 11/25/2019 1608    Assessment/Plan: Patient was counseled on the purpose, proper use, and adverse effects of hydroxychloroquine including nausea/diarrhea, skin rash, headaches, and sun sensitivity.  Discussed importance of annual eye exams while on hydroxychloroquine to monitor to ocular toxicity and discussed importance of frequent laboratory monitoring.  Provided patient with eye exam form for baseline ophthalmologic exam and standing lab instructions.  Provided patient with educational materials on hydroxychloroquine and answered all questions.  Patient consented to hydroxychloroquine.  Will upload consent in the media tab.    Dose will be Plaquenil 200 mg twice daily based on weight of 103.8 kg and height of 5'3". Patient is morbidly obese with ABW of 72 kg with total daily dose of  5.5mg /kg. Can consider reduction in dose pending 1 month labs.  All questions encouraged and answered.  Instructed patient to call with any questions or concerns.  Verlin Fester, PharmD, [place], CPP Clinical Specialty Pharmacist [phone]  01/11/2020 8:48 AM

## 2020-01-13 NOTE — Progress Notes (Signed)
I will discuss results at the follow-up visit.

## 2020-01-14 LAB — SJOGRENS SYNDROME-B EXTRACTABLE NUCLEAR ANTIBODY: SSB (La) (ENA) Antibody, IgG: <1 AI

## 2020-01-14 LAB — SJOGRENS SYNDROME-A EXTRACTABLE NUCLEAR ANTIBODY: SSA (Ro) (ENA) Antibody, IgG: >8 AI — AB

## 2020-01-14 LAB — LUPUS ANTICOAGULANT EVAL W/ REFLEX
PTT-LA Screen: 38 s (ref <=40)
dRVVT: 48 s — ABNORMAL HIGH (ref <=45)

## 2020-01-14 LAB — IGG, IGA, IGM
IgG (Immunoglobin G), Serum: 2038 mg/dL — ABNORMAL HIGH (ref 600–1640)
IgM, Serum: 79 mg/dL (ref 50–300)
Immunoglobulin A: 267 mg/dL (ref 47–310)

## 2020-01-14 LAB — CARDIOLIPIN ANTIBODIES, IGG, IGM, IGA
Anticardiolipin IgA: <11 [APL'U]
Anticardiolipin IgG: <14 [GPL'U]
Anticardiolipin IgM: <12 [MPL'U]

## 2020-01-14 LAB — ANTI-SCLERODERMA ANTIBODY: Scleroderma (Scl-70) (ENA) Antibody, IgG: <1 AI

## 2020-01-14 LAB — C3 AND C4
C3 Complement: 112 mg/dL (ref 83–193)
C4 Complement: 25 mg/dL (ref 15–57)

## 2020-01-14 LAB — CK: Total CK: 38 U/L (ref 29–143)

## 2020-01-14 LAB — RFLX DRVVT CONFRIM: DRVVT CONFIRM: NEGATIVE

## 2020-01-14 LAB — RHEUMATOID FACTOR: Rheumatoid fact SerPl-aCnc: <14 IU/mL (ref <14)

## 2020-01-16 LAB — PROTEIN ELECTROPHORESIS, SERUM, WITH REFLEX
Albumin ELP: 3.6 g/dL — ABNORMAL LOW (ref 3.8–4.8)
Alpha 1: 0.3 g/dL (ref 0.2–0.3)
Alpha 2: 0.7 g/dL (ref 0.5–0.9)
Beta 2: 0.4 g/dL (ref 0.2–0.5)
Beta Globulin: 0.4 g/dL (ref 0.4–0.6)
Gamma Globulin: 1.8 g/dL — ABNORMAL HIGH (ref 0.8–1.7)
Total Protein: 7.2 g/dL (ref 6.1–8.1)

## 2020-01-16 LAB — HIV ANTIBODY (ROUTINE TESTING W REFLEX): HIV 1&2 Ab, 4th Generation: NONREACTIVE

## 2020-01-16 LAB — QUANTIFERON-TB GOLD PLUS
Mitogen-NIL: >10 IU/mL
NIL: 0.02 IU/mL
QuantiFERON-TB Gold Plus: NEGATIVE
TB1-NIL: 0 IU/mL
TB2-NIL: 0 IU/mL

## 2020-01-16 LAB — HEPATITIS B CORE ANTIBODY, IGM: Hep B C IgM: NONREACTIVE

## 2020-01-16 LAB — BETA-2 GLYCOPROTEIN ANTIBODIES
Beta-2 Glyco 1 IgA: <9 SAU (ref <=20)
Beta-2 Glyco 1 IgM: 9 SMU (ref <=20)
Beta-2 Glyco I IgG: <9 SGU (ref <=20)

## 2020-01-16 LAB — HEPATITIS C ANTIBODY
Hepatitis C Ab: NONREACTIVE
SIGNAL TO CUT-OFF: 0.02 (ref <1.00)

## 2020-01-16 LAB — HEPATITIS B SURFACE ANTIGEN: Hepatitis B Surface Ag: NONREACTIVE

## 2020-01-16 LAB — GLUCOSE 6 PHOSPHATE DEHYDROGENASE: G-6PDH: 12.5 U/g Hgb (ref 7.0–20.5)

## 2020-01-16 LAB — THIOPURINE METHYLTRANSFERASE (TPMT), RBC: Thiopurine Methyltransferase, RBC: 16 nmol/hr/mL RBC

## 2020-01-16 LAB — SEDIMENTATION RATE: Sed Rate: 14 mm/h (ref 0–20)

## 2020-01-21 ENCOUNTER — Institutional Professional Consult (permissible substitution): Payer: Self-pay | Admitting: Internal Medicine

## 2020-01-31 NOTE — Progress Notes (Signed)
Office Visit Note  Patient: Jasmine Espinoza             Date of Birth: [birth year]           MRN: [id number]             PCP: Lucky Cowboy, MD Referring: Lucky Cowboy, MD Visit Date: 02/04/2020 Occupation: @GUAROCC @  Subjective:  Medication monitoring   History of Present Illness: Jasmine Espinoza is a 34 y.o. female with history of systemic lupus erythematosus.  Patient started on Plaquenil 200 mg 1 tablet twice daily after her office visit on 01/11/2020.  She is tolerating PLQ without any side effects.  She states she has started to notice improvements since starting on PLQ.  She denies any recent rashes.  She continues to have photosensitivity.  She states she has to apply suncreen to her face up to 5 times per day.  She has intermittent symptoms of Raynaud's but her symptoms have been less frequent since starting on Plaquenil.  She denies any digital ulcerations.  She denies any enlarged lymph nodes or increased fatigue.  She has not had any chest pain or palpitations.  She has been experiencing a cough which she attributes to being out of omeprazole.  She denies any sicca symptoms at this time.  She experiences chronic pain in both knee joints but denies any joint swelling.  She is not had any other joint pain or joint inflammation.     Activities of Daily Living:  Patient reports morning stiffness for 10 minutes.   Patient Denies nocturnal pain.  Difficulty dressing/grooming: Denies Difficulty climbing stairs: Denies Difficulty getting out of chair: Denies Difficulty using hands for taps, buttons, cutlery, and/or writing: Denies  Review of Systems  Constitutional: Positive for fatigue.  HENT: Negative for mouth sores, mouth dryness and nose dryness.   Eyes: Negative for pain, visual disturbance and dryness.  Respiratory: Negative for cough, hemoptysis and difficulty breathing.   Cardiovascular: Positive for swelling in legs/feet. Negative for chest pain, palpitations  and hypertension.  Gastrointestinal: Negative for blood in stool, constipation and diarrhea.  Endocrine: Negative for increased urination.  Genitourinary: Negative for difficulty urinating and painful urination.  Musculoskeletal: Positive for joint swelling and morning stiffness. Negative for arthralgias, joint pain, myalgias, muscle weakness, muscle tenderness and myalgias.  Skin: Negative for color change, pallor, rash, hair loss, nodules/bumps, skin tightness, ulcers and sensitivity to sunlight.  Allergic/Immunologic: Negative for susceptible to infections.  Neurological: Negative for dizziness, numbness, headaches and weakness.  Hematological: Negative for bruising/bleeding tendency and swollen glands.  Psychiatric/Behavioral: Negative for depressed mood and sleep disturbance. The patient is not nervous/anxious.     PMFS History:  Patient Active Problem List   Diagnosis Date Noted  . Obesity 07/27/2014  . Postpartum depression 04/08/2014    Past Medical History:  Diagnosis Date  . Abnormal Pap smear   . Hx of blood clots 2015   on uterus during pregnancy  . Obesity (BMI 30-39.9)    BMI 38  . Postpartum depression 04/08/2014    Family History  Problem Relation Age of Onset  . Cancer Maternal Grandmother        breast, late 78s  . Heart disease Paternal Grandfather        does not know him, died when she was78  . Lupus Paternal Grandmother    Past Surgical History:  Procedure Laterality Date  . COLPOSCOPY  2014  . WISDOM TOOTH EXTRACTION     Social History  Social History Narrative  . Not on file   Immunization History  Administered Date(s) Administered  . Influenza Split 08/01/2015  . Influenza-Unspecified 07/09/2016  . PPD Test 02/02/2015, 03/15/2015, 04/19/2016  . Tdap 04/24/2013     Objective: Vital Signs: BP 118/78 (BP Location: Right Arm, Patient Position: Sitting, Cuff Size: Normal)   Pulse 87   Resp 16   Ht 5\' 3"  (1.6 m)   Wt 225 lb 12.8 oz (102.4  kg)   BMI 40.00 kg/m    Physical Exam Vitals and nursing note reviewed.  Constitutional:      Appearance: She is well-developed.  HENT:     Head: Normocephalic and atraumatic.  Eyes:     Conjunctiva/sclera: Conjunctivae normal.  Pulmonary:     Effort: Pulmonary effort is normal.  Abdominal:     General: Bowel sounds are normal.     Palpations: Abdomen is soft.  Musculoskeletal:     Cervical back: Normal range of motion.  Lymphadenopathy:     Cervical: No cervical adenopathy.  Skin:    General: Skin is warm and dry.     Capillary Refill: Capillary refill takes 2 to 3 seconds.     Comments: No digital ulcerations or signs of gangrene. Diffuse facial erythema noted. No signs of skin thickening or skin tightness.  No nailbed capillary changes noted.  Neurological:     Mental Status: She is alert and oriented to person, place, and time.  Psychiatric:        Behavior: Behavior normal.      Musculoskeletal Exam: C-spine, thoracic spine, lumbar spine good range of motion.  Shoulder joints, elbow joints, wrist joints, MCPs, PIPs, DIPs good range of motion with no synovitis.  She has complete fist formation bilaterally.  Hip joints, knee joints, ankle joints, MCPs, PIPs, DIPs good range of motion with no synovitis.  No warmth or effusion of bilateral knee joints.  No tenderness or swelling of ankle joints.  CDAI Exam: CDAI Score: -- Patient Global: --; Provider Global: -- Swollen: --; Tender: -- Joint Exam 02/04/2020   No joint exam has been documented for this visit   There is currently no information documented on the homunculus. Go to the Rheumatology activity and complete the homunculus joint exam.  Investigation: No additional findings.  Imaging: XR Foot 2 Views Left  Result Date: 01/11/2020 No MCP, PIP or DIP narrowing was noted.  No intertarsal tibiotalar joint space narrowing was noted.  A calcaneal spur was noted.  No erosive changes were noted. Impression:  Unremarkable x-ray of the foot except for calcaneal spur.  XR Foot 2 Views Right  Result Date: 01/11/2020 No MCP, PIP or DIP narrowing was noted.  No intertarsal tibiotalar joint space narrowing was noted.  A calcaneal spur was noted.  No erosive changes were noted. Impression: Unremarkable x-ray of the foot except for calcaneal spur.  XR Hand 2 View Left  Result Date: 01/11/2020 No MCP, PIP or DIP narrowing was noted.  No intercarpal radiocarpal joint space narrowing was noted.  No erosive changes were noted. Impression: Unremarkable x-ray of the hand.  XR Hand 2 View Right  Result Date: 01/11/2020 No MCP, PIP or DIP narrowing was noted.  No intercarpal radiocarpal joint space narrowing was noted.  No erosive changes were noted. Impression: Unremarkable x-ray of the hand.  XR KNEE 3 VIEW LEFT  Result Date: 01/11/2020 No joint space narrowing was noted.  No patellofemoral narrowing was noted.  No chondrocalcinosis was noted. Impression: Unremarkable x-ray  of the knee joint.  XR KNEE 3 VIEW RIGHT  Result Date: 01/11/2020 No joint space narrowing was noted.  No patellofemoral narrowing was noted.  No chondrocalcinosis was noted. Impression: Unremarkable x-ray of the knee joint.   Recent Labs: Lab Results  Component Value Date   WBC 4.5 11/25/2019   HGB 13.7 11/25/2019   PLT 280 11/25/2019   NA 137 11/25/2019   K 4.2 11/25/2019   CL 103 11/25/2019   CO2 25 11/25/2019   GLUCOSE 76 11/25/2019   BUN 9 11/25/2019   CREATININE 0.62 11/25/2019   BILITOT 0.2 11/25/2019   ALKPHOS 91 08/01/2016   AST 12 11/25/2019   ALT 11 11/25/2019   PROT 7.2 01/11/2020   ALBUMIN 3.9 08/01/2016   CALCIUM 9.3 11/25/2019   GFRAA 138 11/25/2019   QFTBGOLDPLUS NEGATIVE 01/11/2020  January 11, 2020 SPEP consistent with chronic inflammatory disease, TB Gold negative, IgG elevated, hepatitis B negative, hepatitis C negative, HIV negative, G6PD normal, TPMT 16 normal, CK 38 Beta-2 GP 1 -, anticardiolipin  negative, lupus anticoagulant negative, C3-C4 normal, anti-SSA> 8.0, anti-SSB negative, SCL 70 -, RF negative  11/25/19: ANA 1:1280 NS, Smith 1.1, RNP >8.0, dsDNA 4, vitamin B12 419, vit D 24, Mg 2.0, TSH 2.39  Speciality Comments: No specialty comments available.  Procedures:  No procedures performed Allergies: Patient has no known allergies.   Assessment / Plan:     Visit Diagnoses: Other organ or system involvement in systemic lupus erythematosus ([place]) - History of fatigue, arthritis, malar rash, Raynaud's, photosensitivity, +ANA, +Smith, + RNP, + Ro, history of DVT during pregnancy, family history of lupus: She is clinically doing well on Plaquenil 200 mg 1 tablet by mouth twice daily which she started after her first office visit on 01/11/2020.  She has been tolerating Plaquenil without any side effects.  We reviewed autoimmune lab work from 01/11/2020 and all questions were addressed.  She started to notice improvement since starting on Plaquenil.  She has chronic pain in both knee joints but no warmth or effusion was noted.  No synovitis was noted on exam today.  She continues to have intermittent symptoms of Raynaud's but hers symptoms have been less frequent since starting on Plaquenil.  No digital ulcerations, nailbed capillary changes, or skin thickening was noted on exam.  She has not had any recent oral or nasal ulcerations.  She is not having any sicca symptoms at this time.  Maller rash noted on exam.  She has diffuse facial erythema due to a recent sunburn.  We discussed the importance of avoiding direct sun exposure.  She was advised to wear sunscreen SPF greater than 50 on a daily basis.  According to the patient to reapply sunscreen on her face up to 5 times daily but continues to have photosensitivity.  She is not experiencing any chest pain, palpitations, or shortness of breath at this time.  She will continue taking Plaquenil 200 mg 1 tablet by mouth twice daily.  We will check CBC and  CMP today to monitor for drug toxicity.  She was advised to notify us if she develops any new or worsening symptoms.  She will follow-up in the office in 3 months.  High risk medication use - She was placed on Plaquenil 200 mg p.o. twice daily on January 11, 2020.  CBC and CMP will be drawn today to monitor for drug toxicity.  She will return for lab work in 3 months and every 5 months.  Standing orders for  CBC and CMP are in place.- Plan: CBC with Differential/Platelet, COMPLETE METABOLIC PANEL WITH GFR  Raynaud's syndrome without gangrene -she has intermittent symptoms of Raynaud's but her symptoms have been more mild and infrequent since starting on Plaquenil.  No nailbed capillary changes were noted.  Capillary refill was 2 to 3 seconds.  No digital ulcerations or signs of gangrene were noted.  We discussed the importance of avoiding triggers.  She will continue to take Plaquenil as prescribed.  Polyarthralgia - X-ray of bilateral hands, bilateral knees and her feet were unremarkable.  She continues to have chronic pain in both knees but no warmth or effusion was noted.  She has no synovitis on exam.   History of blood clots - DVT 13 and 2014 during pregnancy.  Anticardiolipin, beta-2 GP 1 and lupus anticoagulant were negative.  Shortness of breath -She is not experiencing any shortness of breath at this time.  She has been experiencing a cough recently which she attributes to reflux.  She has not been taking omeprazole but will be picking up the refill today.  Other medical conditions are listed as follows:  Seasonal allergies  Family history of systemic lupus erythematosus  History of gastroesophageal reflux (GERD)  Vitamin D deficiency  Orders: Orders Placed This Encounter  Procedures  . CBC with Differential/Platelet  . COMPLETE METABOLIC PANEL WITH GFR   No orders of the defined types were placed in this encounter.   Face-to-face time spent with patient was 30 minutes. Greater  than 50% of time was spent in counseling and coordination of care.  Follow-Up Instructions: Return in about 3 months (around 05/05/2020) for Systemic lupus erythematosus.   Sherron Ales, PA-C  I examined and evaluated the patient with Sherron Ales PA.  Patient is clinically doing much better on Plaquenil.  She had no synovitis on examination today.  She will continue current treatment.  The plan of care was discussed as noted above.  Pollyann Savoy, MD  Note - This record has been created using Animal nutritionist.  Chart creation errors have been sought, but may not always  have been located. Such creation errors do not reflect on  the standard of medical care.

## 2020-02-03 DIAGNOSIS — R059 Cough, unspecified: Secondary | ICD-10-CM

## 2020-02-03 DIAGNOSIS — R05 Cough: Secondary | ICD-10-CM

## 2020-02-03 MED ORDER — OMEPRAZOLE 40 MG PO CPDR: DELAYED_RELEASE_CAPSULE | ORAL | 1 refills | Status: DC

## 2020-02-04 ENCOUNTER — Ambulatory Visit (INDEPENDENT_AMBULATORY_CARE_PROVIDER_SITE_OTHER): Payer: Managed Care, Other (non HMO) | Admitting: Rheumatology

## 2020-02-04 ENCOUNTER — Other Ambulatory Visit: Payer: Self-pay

## 2020-02-04 ENCOUNTER — Encounter: Payer: Self-pay | Admitting: Rheumatology

## 2020-02-04 VITALS — BP 118/78 | HR 87 | Resp 16 | Ht 63.0 in | Wt 225.8 lb

## 2020-02-04 DIAGNOSIS — Z86718 Personal history of other venous thrombosis and embolism: Secondary | ICD-10-CM

## 2020-02-04 DIAGNOSIS — E559 Vitamin D deficiency, unspecified: Secondary | ICD-10-CM

## 2020-02-04 DIAGNOSIS — M2550 Pain in unspecified joint: Secondary | ICD-10-CM

## 2020-02-04 DIAGNOSIS — I7300 Raynaud's syndrome without gangrene: Secondary | ICD-10-CM

## 2020-02-04 DIAGNOSIS — Z8719 Personal history of other diseases of the digestive system: Secondary | ICD-10-CM

## 2020-02-04 DIAGNOSIS — J302 Other seasonal allergic rhinitis: Secondary | ICD-10-CM

## 2020-02-04 DIAGNOSIS — M3219 Other organ or system involvement in systemic lupus erythematosus: Secondary | ICD-10-CM

## 2020-02-04 DIAGNOSIS — R0602 Shortness of breath: Secondary | ICD-10-CM

## 2020-02-04 DIAGNOSIS — Z79899 Other long term (current) drug therapy: Secondary | ICD-10-CM

## 2020-02-04 DIAGNOSIS — Z8269 Family history of other diseases of the musculoskeletal system and connective tissue: Secondary | ICD-10-CM

## 2020-02-04 LAB — CBC WITH DIFFERENTIAL/PLATELET
Absolute Monocytes: 530 cells/uL (ref 200–950)
Basophils Absolute: 31 cells/uL (ref 0–200)
Basophils Relative: 0.6 %
Eosinophils Absolute: 51 cells/uL (ref 15–500)
Eosinophils Relative: 1 %
HCT: 42.9 % (ref 35.0–45.0)
Hemoglobin: 13.8 g/dL (ref 11.7–15.5)
Lymphs Abs: 1382 cells/uL (ref 850–3900)
MCH: 27.4 pg (ref 27.0–33.0)
MCHC: 32.2 g/dL (ref 32.0–36.0)
MCV: 85.3 fL (ref 80.0–100.0)
MPV: 10.3 fL (ref 7.5–12.5)
Monocytes Relative: 10.4 %
Neutro Abs: 3106 cells/uL (ref 1500–7800)
Neutrophils Relative %: 60.9 %
Platelets: 280 10*3/uL (ref 140–400)
RBC: 5.03 10*6/uL (ref 3.80–5.10)
RDW: 13.2 % (ref 11.0–15.0)
Total Lymphocyte: 27.1 %
WBC: 5.1 10*3/uL (ref 3.8–10.8)

## 2020-02-04 LAB — COMPLETE METABOLIC PANEL WITH GFR
AG Ratio: 1.2 (calc) (ref 1.0–2.5)
ALT: 9 U/L (ref 6–29)
AST: 12 U/L (ref 10–30)
Albumin: 4.3 g/dL (ref 3.6–5.1)
Alkaline phosphatase (APISO): 83 U/L (ref 31–125)
BUN: 10 mg/dL (ref 7–25)
CO2: 26 mmol/L (ref 20–32)
Calcium: 9.3 mg/dL (ref 8.6–10.2)
Chloride: 105 mmol/L (ref 98–110)
Creat: 0.78 mg/dL (ref 0.50–1.10)
GFR, Est African American: 117 mL/min/{1.73_m2} (ref >=60)
GFR, Est Non African American: 101 mL/min/{1.73_m2} (ref >=60)
Globulin: 3.5 g/dL (calc) (ref 1.9–3.7)
Glucose, Bld: 82 mg/dL (ref 65–99)
Potassium: 4.7 mmol/L (ref 3.5–5.3)
Sodium: 138 mmol/L (ref 135–146)
Total Bilirubin: 0.3 mg/dL (ref 0.2–1.2)
Total Protein: 7.8 g/dL (ref 6.1–8.1)

## 2020-02-05 NOTE — Progress Notes (Signed)
CBC and CMP WNL

## 2020-04-28 ENCOUNTER — Other Ambulatory Visit: Payer: Self-pay | Admitting: Rheumatology

## 2020-04-28 DIAGNOSIS — M3219 Other organ or system involvement in systemic lupus erythematosus: Secondary | ICD-10-CM

## 2020-04-28 NOTE — Progress Notes (Deleted)
Office Visit Note  Patient: Jasmine Espinoza             Date of Birth: Jan 26, 1986           MRN: [id number]             PCP: Lucky Cowboy, MD Referring: Lucky Cowboy, MD Visit Date: 05/12/2020 Occupation: @GUAROCC @  Subjective:  No chief complaint on file.   History of Present Illness: Jasmine Espinoza is a 34 y.o. female ***   Activities of Daily Living:  Patient reports morning stiffness for *** {minute/hour:19697}.   Patient {ACTIONS;DENIES/REPORTS:21021675::"Denies"} nocturnal pain.  Difficulty dressing/grooming: {ACTIONS;DENIES/REPORTS:21021675::"Denies"} Difficulty climbing stairs: {ACTIONS;DENIES/REPORTS:21021675::"Denies"} Difficulty getting out of chair: {ACTIONS;DENIES/REPORTS:21021675::"Denies"} Difficulty using hands for taps, buttons, cutlery, and/or writing: {ACTIONS;DENIES/REPORTS:21021675::"Denies"}  No Rheumatology ROS completed.   PMFS History:  Patient Active Problem List   Diagnosis Date Noted  . Obesity 07/27/2014  . Postpartum depression 04/08/2014    Past Medical History:  Diagnosis Date  . Abnormal Pap smear   . Hx of blood clots 2015   on uterus during pregnancy  . Obesity (BMI 30-39.9)    BMI 38  . Postpartum depression 04/08/2014    Family History  Problem Relation Age of Onset  . Cancer Maternal Grandmother        breast, late 37s  . Heart disease Paternal Grandfather        does not know him, died when she was36  . Lupus Paternal Grandmother    Past Surgical History:  Procedure Laterality Date  . COLPOSCOPY  2014  . WISDOM TOOTH EXTRACTION     Social History   Social History Narrative  . Not on file   Immunization History  Administered Date(s) Administered  . Influenza Split 08/01/2015  . Influenza-Unspecified 07/09/2016  . PPD Test 02/02/2015, 03/15/2015, 04/19/2016  . Tdap 04/24/2013     Objective: Vital Signs: There were no vitals taken for this visit.   Physical Exam   Musculoskeletal Exam:  ***  CDAI Exam: CDAI Score: -- Patient Global: --; Provider Global: -- Swollen: --; Tender: -- Joint Exam 05/12/2020   No joint exam has been documented for this visit   There is currently no information documented on the homunculus. Go to the Rheumatology activity and complete the homunculus joint exam.  Investigation: No additional findings.  Imaging: No results found.  Recent Labs: Lab Results  Component Value Date   WBC 5.1 02/04/2020   HGB 13.8 02/04/2020   PLT 280 02/04/2020   NA 138 02/04/2020   K 4.7 02/04/2020   CL 105 02/04/2020   CO2 26 02/04/2020   GLUCOSE 82 02/04/2020   BUN 10 02/04/2020   CREATININE 0.78 02/04/2020   BILITOT 0.3 02/04/2020   ALKPHOS 91 08/01/2016   AST 12 02/04/2020   ALT 9 02/04/2020   PROT 7.8 02/04/2020   ALBUMIN 3.9 08/01/2016   CALCIUM 9.3 02/04/2020   GFRAA 117 02/04/2020   QFTBGOLDPLUS NEGATIVE 01/11/2020    Speciality Comments: No specialty comments available.  Procedures:  No procedures performed Allergies: Patient has no known allergies.   Assessment / Plan:     Visit Diagnoses: No diagnosis found.  Orders: No orders of the defined types were placed in this encounter.  No orders of the defined types were placed in this encounter.   Face-to-face time spent with patient was *** minutes. Greater than 50% of time was spent in counseling and coordination of care.  Follow-Up Instructions: No follow-ups on file.   Rhetta Cleek  C Adore Kithcart, CMA  Note - This record has been created using Animal nutritionist.  Chart creation errors have been sought, but may not always  have been located. Such creation errors do not reflect on  the standard of medical care.

## 2020-04-28 NOTE — Telephone Encounter (Signed)
Patient has not gotten PLQ eye exam and is calling to schedule an appointment. Patient will call back to advise [place] of appointment.

## 2020-04-28 NOTE — Telephone Encounter (Signed)
Last Visit: 02/04/2020 Next Visit: 05/12/2020 Labs: 02/04/2020 CBC and CMP WNL Eye exam: no baseline eye exam on file  Current Dose per office note 02/04/2020: Plaquenil 200 mg p.o. twice daily  DX: Other organ or system involvement in systemic lupus erythematosus   Okay to refill Plaquenil?

## 2020-04-28 NOTE — Telephone Encounter (Signed)
Please ask patient where to she get her eye examination.  Then call the ophthalmologist office to get the records.

## 2020-05-06 ENCOUNTER — Encounter: Payer: Self-pay | Admitting: Rheumatology

## 2020-05-09 ENCOUNTER — Telehealth: Payer: Self-pay | Admitting: Rheumatology

## 2020-05-09 NOTE — Telephone Encounter (Signed)
If she tests positive for covid-19 she will require the covid antibody infusion due to her history of SLE and being on PLQ.

## 2020-05-09 NOTE — Telephone Encounter (Signed)
Patient called to cancel her appointment on Thursday, 05/12/20.  Patient states she was [place] the beach last [place] with her mother-in-law who tested positive for COVID.  Patient states two days later Thursday, 05/05/20 she began developing a low grade fever, headache, and cough.  Patient states she was dehydrated and received IV at the hospital.  Patient states she has not gotten the COVID vaccine.  Patient states she has not received the results of her COVID test and will have to check with her husband if they did one.  Patient states she will call back with the results.

## 2020-05-09 NOTE — Telephone Encounter (Signed)
Patient advised if she tests positive for covid-19 she will require the covid antibody infusion due to her history of SLE and being on PLQ. Patient will contact the office if she test postive.

## 2020-05-11 ENCOUNTER — Telehealth: Payer: Self-pay | Admitting: Rheumatology

## 2020-05-11 DIAGNOSIS — R059 Cough, unspecified: Secondary | ICD-10-CM

## 2020-05-11 DIAGNOSIS — R05 Cough: Secondary | ICD-10-CM | POA: Diagnosis not present

## 2020-05-11 MED ORDER — DEXAMETHASONE 2 MG PO TABS: ORAL_TABLET | ORAL | 0 refills | Status: DC

## 2020-05-11 MED ORDER — BENZONATATE 200 MG PO CAPS: 200.0000 mg | ORAL_CAPSULE | Freq: Three times a day (TID) | ORAL | 0 refills | Status: DC | PRN

## 2020-05-11 MED ORDER — ONDANSETRON HCL 4 MG PO TABS: 4.0000 mg | ORAL_TABLET | Freq: Every day | ORAL | 1 refills | Status: DC | PRN

## 2020-05-11 MED ORDER — PROMETHAZINE-DM 6.25-15 MG/5ML PO SYRP: 5.0000 mL | ORAL_SOLUTION | Freq: Four times a day (QID) | ORAL | 1 refills | Status: DC | PRN

## 2020-05-11 MED ORDER — FAMOTIDINE 40 MG PO TABS: ORAL_TABLET | ORAL | 1 refills | Status: DC

## 2020-05-11 NOTE — Telephone Encounter (Signed)
Patient has SLE and she is on Plaquenil.  She should continue Plaquenil.  Please refer her to Covid antibody infusion clinic.

## 2020-05-11 NOTE — Telephone Encounter (Signed)
Patient called to let you know COVID test came back positive. Please call to advise.

## 2020-05-11 NOTE — Telephone Encounter (Signed)
Patient has SLE and she is on Plaquenil.  Patient advised per Dr. Corliss Skains she should continue Plaquenil.  Patient advised we will refer her to Covid antibody infusion clinic for an infusion.   Placed call to the Covid antibody infusion clinic and left message with patient's information so they may call patient to schedule.

## 2020-05-11 NOTE — Telephone Encounter (Signed)
THIS ENCOUNTER IS A VIRTUAL VISIT DUE TO COVID-19 - PATIENT WAS NOT SEEN IN THE OFFICE.  PATIENT HAS CONSENTED TO VIRTUAL VISIT / TELEMEDICINE VISIT   Virtual Visit via telephone Note  I connected with Sonia Baller on 05/11/2020 by telephone.  I verified that I am speaking with the correct person using two identifiers.    I discussed the limitations of evaluation and management by telemedicine and the availability of in person appointments. The patient expressed understanding and agreed to proceed.  History of Present Illness: 34 y.o. WF on plaquenil presents with URI symptoms and concern for COVID for possible exposure, she has not been vaccinated. She started with symptoms Thursday. She has had recurrent fever, taking tylenol 1000mg  and ibuprofen 750mg , she has cough. She has nyquil and dayquil that is not helping cough.  She has nausea, worse with coughing, will also happen in the morning. No vomiting, or diarrhea. No SOB, some CP with cough only.  She is holding down fluids.   No chance of pregnancy.   Medications    Current Outpatient Medications (Respiratory):  .  cetirizine (ZYRTEC) 5 MG tablet, Take 5 mg by mouth daily. .  fluticasone (FLONASE) 50 MCG/ACT nasal spray, Place 2 sprays into both nostrils daily.    Current Outpatient Medications (Other):  .  hydroxychloroquine (PLAQUENIL) 200 MG tablet, TAKE ONE TABLET BY MOUTH TWICE DAILY .  ketoconazole (NIZORAL) 2 % cream, Apply 1 application topically 2 (two) times daily.  omeprazole (PRILOSEC) 40 MG capsule, Take 1 capsule Daily for Indigestion & Heartburn .  Vitamin D, Ergocalciferol, (DRISDOL) 1.25 MG (50000 UT) CAPS capsule, 1 pill 3 days a week for 8 weeks for vitamin d deficiency  Problem list She has Postpartum depression and Obesity on their problem list.   Observations/Objective: General Appearance:Well sounding, in no apparent distress.  ENT/Mouth: No hoarseness, + cough for duration of visit.   Respiratory: completing full sentences without distress, without audible wheeze Neuro: Awake and oriented X 3,  Psych:  Insight and Judgment appropriate.    Assessment and Plan: Diagnoses and all orders for this visit:  Cough -     ondansetron (ZOFRAN) 4 MG tablet; Take 1 tablet (4 mg total) by mouth daily as needed for nausea or vomiting (Not sedating). -     famotidine (PEPCID) 40 MG tablet; Once daily for nausea/GERD -     dexamethasone (DECADRON) 2 MG tablet; 1 pill BID with food for 3 days, QD x 4 days, can discontinue if symptoms improve- can take tylenol with it- do not take ibuprofen -     benzonatate (TESSALON) 200 MG capsule; Take 1 capsule (200 mg total) by mouth 3 (three) times daily as needed for cough (Max: 600mg  per day- not sedating). -     promethazine-dextromethorphan (PROMETHAZINE-DM) 6.25-15 MG/5ML syrup; Take 5 mLs by mouth 4 (four) times daily as needed for cough (cough at night).   URI versus COVID Will get tested for COVID and will do self isolation at home Suggested symptomatic OTC remedies. Nasal saline spray for congestion. Nasal steroids, allergy pill, staying hydrated Follow up as needed via mychart or text. Please go to the ER or call 911 if symptoms become worse.     Follow Up Instructions:  I discussed the assessment and treatment plan with the patient. The patient was provided an opportunity to ask questions and all were answered. The patient agreed with the plan and demonstrated an understanding of the instructions.   The  patient was advised to call back or seek an in-person evaluation if the symptoms worsen or if the condition fails to improve as anticipated.  I provided 15 minutes of non-face-to-face time during this encounter.   Quentin Mulling, PA-C

## 2020-05-12 ENCOUNTER — Encounter: Payer: Self-pay | Admitting: Physician Assistant

## 2020-05-12 ENCOUNTER — Other Ambulatory Visit: Payer: Self-pay | Admitting: Physician Assistant

## 2020-05-12 ENCOUNTER — Ambulatory Visit: Payer: Self-pay | Admitting: Rheumatology

## 2020-05-12 DIAGNOSIS — U071 COVID-19: Secondary | ICD-10-CM

## 2020-05-12 DIAGNOSIS — E6609 Other obesity due to excess calories: Secondary | ICD-10-CM

## 2020-05-12 DIAGNOSIS — M329 Systemic lupus erythematosus, unspecified: Secondary | ICD-10-CM

## 2020-05-12 MED ORDER — SODIUM CHLORIDE 0.9 % IV SOLN
Freq: Once | INTRAVENOUS | Status: AC
  Filled 2020-05-12: qty 600

## 2020-05-12 NOTE — Progress Notes (Signed)
I connected by phone with Sonia Baller on 05/12/2020 at 9:39 AM to discuss the potential use of a new treatment for mild to moderate COVID-19 viral infection in non-hospitalized patients.  This patient is a 34 y.o. female that meets the FDA criteria for Emergency Use Authorization of COVID monoclonal antibody casirivimab/imdevimab.  Has a (+) direct SARS-CoV-2 viral test result  Has mild or moderate COVID-19   Is NOT hospitalized due to COVID-19  Is within 10 days of symptom onset  Has at least one of the high risk factor(s) for progression to severe COVID-19 and/or hospitalization as defined in EUA.  Specific high risk criteria : Immunosuppressive Disease or Treatment and obesity   I have spoken and communicated the following to the patient or parent/caregiver regarding COVID monoclonal antibody treatment:  1. FDA has authorized the emergency use for the treatment of mild to moderate COVID-19 in adults and pediatric patients with positive results of direct SARS-CoV-2 viral testing who are 65 years of age and older weighing at least 40 kg, and who are at high risk for progressing to severe COVID-19 and/or hospitalization.  2. The significant known and potential risks and benefits of COVID monoclonal antibody, and the extent to which such potential risks and benefits are unknown.  3. Information on available alternative treatments and the risks and benefits of those alternatives, including clinical trials.  4. Patients treated with COVID monoclonal antibody should continue to self-isolate and use infection control measures (e.g., wear mask, isolate, social distance, avoid sharing personal items, clean and disinfect "high touch" surfaces, and frequent handwashing) according to CDC guidelines.   5. The patient or parent/caregiver has the option to accept or refuse COVID monoclonal antibody treatment.  After reviewing this information with the patient, The patient agreed to proceed with  receiving casirivimab\imdevimab infusion and will be provided a copy of the Fact sheet prior to receiving the infusion.  Sx onset 7/29. Set up for infusion on 8/6 @ 12:30pm. Directions given to [facility]. Pt is aware that insurance will be charged an infusion fee.   Cline Crock 05/12/2020 9:39 AM

## 2020-05-12 NOTE — Telephone Encounter (Signed)
Patient scheduled for an infusion on 05/13/2020.

## 2020-05-13 ENCOUNTER — Ambulatory Visit (HOSPITAL_COMMUNITY)
Admission: RE | Admit: 2020-05-13 | Discharge: 2020-05-13 | Disposition: A | Discharge status: Home / Self Care | Payer: BC Managed Care – PPO | Source: Ambulatory Visit | Attending: Pulmonary Disease | Admitting: Pulmonary Disease

## 2020-05-13 DIAGNOSIS — M329 Systemic lupus erythematosus, unspecified: Secondary | ICD-10-CM | POA: Diagnosis not present

## 2020-05-13 DIAGNOSIS — E6609 Other obesity due to excess calories: Secondary | ICD-10-CM | POA: Insufficient documentation

## 2020-05-13 DIAGNOSIS — U071 COVID-19: Secondary | ICD-10-CM | POA: Diagnosis not present

## 2020-05-13 MED ORDER — ALBUTEROL SULFATE HFA 108 (90 BASE) MCG/ACT IN AERS: 2.0000 | INHALATION_SPRAY | Freq: Once | RESPIRATORY_TRACT | Status: DC | PRN

## 2020-05-13 MED ORDER — METHYLPREDNISOLONE SODIUM SUCC 125 MG IJ SOLR: 125.0000 mg | Freq: Once | INTRAMUSCULAR | Status: DC | PRN

## 2020-05-13 MED ORDER — EPINEPHRINE 0.3 MG/0.3ML IJ SOAJ: 0.3000 mg | Freq: Once | INTRAMUSCULAR | Status: DC | PRN

## 2020-05-13 MED ORDER — DIPHENHYDRAMINE HCL 50 MG/ML IJ SOLN: 50.0000 mg | Freq: Once | INTRAMUSCULAR | Status: DC | PRN

## 2020-05-13 MED ORDER — SODIUM CHLORIDE 0.9 % IV SOLN: INTRAVENOUS | Status: DC | PRN

## 2020-05-13 MED ORDER — FAMOTIDINE IN NACL 20-0.9 MG/50ML-% IV SOLN: 20.0000 mg | Freq: Once | INTRAVENOUS | Status: DC | PRN

## 2020-05-13 NOTE — Progress Notes (Signed)
  Diagnosis: COVID-19  Physician: DR. Shan Levans  Procedure: Covid Infusion Clinic Med: casirivimab\imdevimab infusion - Provided patient with casirivimab\imdevimab fact sheet for patients, parents and caregivers prior to infusion.  Complications: No immediate complications noted.  Discharge: Discharged home   Essie Hart 05/13/2020

## 2020-05-13 NOTE — Discharge Instructions (Signed)

## 2020-05-16 ENCOUNTER — Emergency Department (HOSPITAL_BASED_OUTPATIENT_CLINIC_OR_DEPARTMENT_OTHER): Payer: BC Managed Care – PPO

## 2020-05-16 ENCOUNTER — Emergency Department (HOSPITAL_BASED_OUTPATIENT_CLINIC_OR_DEPARTMENT_OTHER)
Admission: EM | Admit: 2020-05-16 | Discharge: 2020-05-16 | Disposition: A | Discharge status: Home / Self Care | Payer: BC Managed Care – PPO | Attending: Emergency Medicine | Admitting: Emergency Medicine

## 2020-05-16 ENCOUNTER — Other Ambulatory Visit: Payer: Self-pay

## 2020-05-16 ENCOUNTER — Encounter (HOSPITAL_BASED_OUTPATIENT_CLINIC_OR_DEPARTMENT_OTHER): Payer: Self-pay | Admitting: *Deleted

## 2020-05-16 DIAGNOSIS — J1282 Pneumonia due to coronavirus disease 2019: Secondary | ICD-10-CM | POA: Insufficient documentation

## 2020-05-16 DIAGNOSIS — R0602 Shortness of breath: Secondary | ICD-10-CM | POA: Diagnosis not present

## 2020-05-16 DIAGNOSIS — U071 COVID-19: Secondary | ICD-10-CM | POA: Diagnosis not present

## 2020-05-16 DIAGNOSIS — J189 Pneumonia, unspecified organism: Secondary | ICD-10-CM | POA: Diagnosis not present

## 2020-05-16 DIAGNOSIS — Z79899 Other long term (current) drug therapy: Secondary | ICD-10-CM | POA: Diagnosis not present

## 2020-05-16 DIAGNOSIS — R110 Nausea: Secondary | ICD-10-CM | POA: Diagnosis not present

## 2020-05-16 LAB — CBC WITH DIFFERENTIAL/PLATELET
Abs Immature Granulocytes: 0.05 10*3/uL (ref 0.00–0.07)
Basophils Absolute: 0 10*3/uL (ref 0.0–0.1)
Basophils Relative: 0 %
Eosinophils Absolute: 0 10*3/uL (ref 0.0–0.5)
Eosinophils Relative: 0 %
HCT: 45.1 % (ref 36.0–46.0)
Hemoglobin: 14.3 g/dL (ref 12.0–15.0)
Immature Granulocytes: 1 %
Lymphocytes Relative: 13 %
Lymphs Abs: 0.7 10*3/uL (ref 0.7–4.0)
MCH: 26.9 pg (ref 26.0–34.0)
MCHC: 31.7 g/dL (ref 30.0–36.0)
MCV: 84.9 fL (ref 80.0–100.0)
Monocytes Absolute: 0.3 10*3/uL (ref 0.1–1.0)
Monocytes Relative: 6 %
Neutro Abs: 4.2 10*3/uL (ref 1.7–7.7)
Neutrophils Relative %: 80 %
Platelets: 301 10*3/uL (ref 150–400)
RBC: 5.31 MIL/uL — ABNORMAL HIGH (ref 3.87–5.11)
RDW: 14.9 % (ref 11.5–15.5)
WBC: 5.3 10*3/uL (ref 4.0–10.5)
nRBC: 0 % (ref 0.0–0.2)

## 2020-05-16 LAB — BASIC METABOLIC PANEL
Anion gap: 11 (ref 5–15)
BUN: 15 mg/dL (ref 6–20)
CO2: 24 mmol/L (ref 22–32)
Calcium: 8.4 mg/dL — ABNORMAL LOW (ref 8.9–10.3)
Chloride: 102 mmol/L (ref 98–111)
Creatinine, Ser: 0.68 mg/dL (ref 0.44–1.00)
GFR calc Af Amer: >60 mL/min (ref >60)
GFR calc non Af Amer: >60 mL/min (ref >60)
Glucose, Bld: 117 mg/dL — ABNORMAL HIGH (ref 70–99)
Potassium: 4.1 mmol/L (ref 3.5–5.1)
Sodium: 137 mmol/L (ref 135–145)

## 2020-05-16 LAB — D-DIMER, QUANTITATIVE: D-Dimer, Quant: 1.17 ug/mL-FEU — ABNORMAL HIGH (ref 0.00–0.50)

## 2020-05-16 LAB — TROPONIN I (HIGH SENSITIVITY): Troponin I (High Sensitivity): 4 ng/L (ref <18)

## 2020-05-16 MED ORDER — IOHEXOL 350 MG/ML SOLN
100.0000 mL | Freq: Once | INTRAVENOUS | Status: AC | PRN
  Administered 2020-05-16: 100 mL via INTRAVENOUS

## 2020-05-16 MED ORDER — KETOROLAC TROMETHAMINE 15 MG/ML IJ SOLN
15.0000 mg | Freq: Once | INTRAMUSCULAR | Status: AC
  Administered 2020-05-16: 15 mg via INTRAVENOUS
  Filled 2020-05-16: qty 1

## 2020-05-16 NOTE — ED Provider Notes (Signed)
[facility] EMERGENCY DEPARTMENT Provider Note   CSN: [id number] Arrival date & time: 05/16/20  1340     History No chief complaint on file.   Jasmine Espinoza is a 34 y.o. female.  Presents to ER with concern for worsening Covid symptoms.  Patient reports that she was diagnosed last week at [facility].  Has had approximately 10 days of symptoms at this time.  On Friday she got the interbody and fusion outpatient.  She is principally concerned today for increased shortness of breath.  She has been having near daily fever since onset of symptoms.  Today no fever.  Also having muscle aches and body aches, some nausea.  Significant cough, reports that her shortness of breath is worsened with coughing spells, exertion.  No chest pain.  Hx Lupus on plaquenil.   HPI     Past Medical History:  Diagnosis Date  . Abnormal Pap smear   . Hx of blood clots 2015   on uterus during pregnancy  . Lupus (HCC)   . Obesity (BMI 30-39.9)    BMI 38  . Postpartum depression 04/08/2014    Patient Active Problem List   Diagnosis Date Noted  . Obesity 07/27/2014  . Postpartum depression 04/08/2014    Past Surgical History:  Procedure Laterality Date  . COLPOSCOPY  2014  . WISDOM TOOTH EXTRACTION       OB History    Gravida  2   Para  2   Term  1   Preterm  1   AB      Living  2     SAB      TAB      Ectopic      Multiple      Live Births  2           Family History  Problem Relation Age of Onset  . Cancer Maternal Grandmother        breast, late 54s  . Heart disease Paternal Grandfather        does not know him, died when she was35  . Lupus Paternal Grandmother     Social History   Tobacco Use  . Smoking status: Never Smoker  . Smokeless tobacco: Never Used  Vaping Use  . Vaping Use: Never used  Substance Use Topics  . Alcohol use: No  . Drug use: No    Home Medications Prior to Admission medications   Medication Sig Start Date End Date Taking?  Authorizing Provider  benzonatate (TESSALON) 200 MG capsule Take 1 capsule (200 mg total) by mouth 3 (three) times daily as needed for cough (Max: 600mg  per day- not sedating). 05/11/20   07/11/20, PA-C  cetirizine (ZYRTEC) 5 MG tablet Take 5 mg by mouth daily.    [provider]  dexamethasone (DECADRON) 2 MG tablet 1 pill BID with food for 3 days, QD x 4 days, can discontinue if symptoms improve- can take tylenol with it- do not take ibuprofen 05/11/20   07/11/20, PA-C  famotidine (PEPCID) 40 MG tablet Once daily for nausea/GERD 05/11/20   07/11/20, PA-C  fluticasone [facility]) 50 MCG/ACT nasal spray Place 2 sprays into both nostrils daily. 06/21/16   06/23/16, PA-C  hydroxychloroquine (PLAQUENIL) 200 MG tablet TAKE ONE TABLET BY MOUTH TWICE DAILY 04/28/20   04/30/20, MD  ketoconazole (NIZORAL) 2 % cream Apply 1 application topically 2 (two) times daily. 11/25/19   11/27/19, PA-C  omeprazole (PRILOSEC) 40  MG capsule Take 1 capsule Daily for Indigestion & Heartburn 02/03/20   Quentin Mulling, PA-C  ondansetron (ZOFRAN) 4 MG tablet Take 1 tablet (4 mg total) by mouth daily as needed for nausea or vomiting (Not sedating). 05/11/20 05/11/21  Quentin Mulling, PA-C  promethazine-dextromethorphan (PROMETHAZINE-DM) 6.25-15 MG/5ML syrup Take 5 mLs by mouth 4 (four) times daily as needed for cough (cough at night). 05/11/20   Quentin Mulling, PA-C  Vitamin D, Ergocalciferol, (DRISDOL) 1.25 MG (50000 UT) CAPS capsule 1 pill 3 days a week for 8 weeks for vitamin d deficiency 10/13/18   Quentin Mulling, PA-C    Allergies    Patient has no known allergies.  Review of Systems   Review of Systems  Constitutional: Positive for chills and fever.  HENT: Negative for ear pain and sore throat.   Eyes: Negative for pain and visual disturbance.  Respiratory: Positive for cough and shortness of breath.   Cardiovascular: Negative for chest pain and palpitations.  Gastrointestinal:  Positive for nausea. Negative for abdominal pain and vomiting.  Genitourinary: Negative for dysuria and hematuria.  Musculoskeletal: Negative for arthralgias and back pain.  Skin: Negative for color change and rash.  Neurological: Negative for seizures and syncope.  All other systems reviewed and are negative.   Physical Exam Updated Vital Signs BP 125/86   Pulse 67   Temp 98.7 F (37.1 C) (Oral)   Resp 20   Ht 5\' 3"  (1.6 m)   Wt 102.4 kg   SpO2 100%   BMI 39.99 kg/m   Physical Exam Vitals and nursing note reviewed.  Constitutional:      General: She is not in acute distress.    Appearance: She is well-developed.  HENT:     Head: Normocephalic and atraumatic.     Nose: Nose normal.     Mouth/Throat:     Mouth: Mucous membranes are moist.  Eyes:     Conjunctiva/sclera: Conjunctivae normal.  Cardiovascular:     Rate and Rhythm: Normal rate and regular rhythm.     Heart sounds: No murmur heard.   Pulmonary:     Effort: Pulmonary effort is normal. No respiratory distress.     Breath sounds: Normal breath sounds.  Abdominal:     Palpations: Abdomen is soft.     Tenderness: There is no abdominal tenderness.  Musculoskeletal:     Cervical back: Neck supple. No rigidity.  Skin:    General: Skin is warm and dry.  Neurological:     Mental Status: She is alert.     ED Results / Procedures / Treatments   Labs (all labs ordered are listed, but only abnormal results are displayed) Labs Reviewed  CBC WITH DIFFERENTIAL/PLATELET - Abnormal; Notable for the following components:      Result Value   RBC 5.31 (*)    All other components within normal limits  BASIC METABOLIC PANEL - Abnormal; Notable for the following components:   Glucose, Bld 117 (*)    Calcium 8.4 (*)    All other components within normal limits  D-DIMER, QUANTITATIVE (NOT AT [facility]) - Abnormal; Notable for the following components:   D-Dimer, Quant 1.17 (*)    All other components within normal limits    TROPONIN I (HIGH SENSITIVITY)    EKG EKG Interpretation  Date/Time:  Monday May 16 2020 14:24:14 EDT Ventricular Rate:  91 PR Interval:  110 QRS Duration: 92 QT Interval:  376 QTC Calculation: 462 R Axis:   84 Text Interpretation: Sinus  rhythm with short PR Otherwise normal ECG No old tracing to compare Confirmed by Meridee ScoreButler, Michael [phone]([id number]) on 05/16/2020 2:28:22 PM   Radiology CT Angio Chest PE W and/or Wo Contrast  Result Date: 05/16/2020 CLINICAL DATA:  Coronavirus infection. Shortness of breath. Elevated D-dimer. EXAM: CT ANGIOGRAPHY CHEST WITH CONTRAST TECHNIQUE: Multidetector CT imaging of the chest was performed using the standard protocol during bolus administration of intravenous contrast. Multiplanar CT image reconstructions and MIPs were obtained to evaluate the vascular anatomy. CONTRAST:  100mL OMNIPAQUE IOHEXOL 350 MG/ML SOLN COMPARISON:  Chest radiography same day FINDINGS: Cardiovascular: Pulmonary arterial opacification is good. There are no pulmonary emboli. Heart size is normal. No pericardial effusion. Mediastinum/Nodes: Normal Lungs/Pleura: There is hazy and patchy pulmonary infiltrate asymmetrically much more marked in the left lung than the right but definitely present on both sides. Findings consistent with viral pneumonia. No pleural effusion. No lobar collapse. Upper Abdomen: Normal Musculoskeletal: Normal Review of the MIP images confirms the above findings. IMPRESSION: No pulmonary emboli. Widespread hazy and patchy pulmonary infiltrates asymmetrically more pronounced in the left lung than the right but definitely bilateral, consistent with viral pneumonia. No lobar collapse or pleural effusion. Electronically Signed   By: Paulina FusiMark  Shogry M.D.   On: 05/16/2020 19:41   DG Chest Portable 1 View  Result Date: 05/16/2020 CLINICAL DATA:  COVID positive patient.  Symptoms for 10 days. EXAM: PORTABLE CHEST 1 VIEW COMPARISON:  11/09/2019 FINDINGS: Left lower lobe interstitial  and alveolar airspace disease. No pleural effusion or pneumothorax. Stable cardiomediastinal silhouette. No aggressive osseous lesion. IMPRESSION: Left lower lobe pneumonia. Electronically Signed   By: Elige KoHetal  Patel   On: 05/16/2020 14:42    Procedures Procedures (including critical care time)  Medications Ordered in ED Medications  ketorolac (TORADOL) 15 MG/ML injection 15 mg (15 mg Intravenous Given 05/16/20 1744)  iohexol (OMNIPAQUE) 350 MG/ML injection 100 mL (100 mLs Intravenous Contrast Given 05/16/20 1927)    ED Course  I have reviewed the triage vital signs and the nursing notes.  Pertinent labs & imaging results that were available during my care of the patient were reviewed by me and considered in my medical decision making (see chart for details).    MDM Rules/Calculators/A&P                          34 year old lady presenting to ER with concern for worsening shortness of breath and recently diagnosed COVID-19.  She has history of SLE, on Plaquenil chronically.  Enrolled in antibiotic infusion therapy.  Given worsening symptoms, check labs including dimer, CXR.  Lab work stable except noted elevated dimer.  CTA chest ordered to further investigate, rule out pulmonary embolism.  No PE, demonstrated bilateral infiltrates consistent with viral pneumonia.  Suspect all her symptoms today are related to COVID-19, Covid pneumonia.  Currently, she does not have accessory muscle use, no significant tachypnea, no hypoxia.  I believe she is appropriate for continued outpatient management at this time.  Discussed natural disease course with patient and concern for higher risk given on Plaquenil.  Provided strict return precautions should she have worsening of her condition.  She is agreeable and discharged home.  Recommended close virtual recheck with her primary doctor.    After the discussed management above, the patient was determined to be safe for discharge.  The patient was in agreement  with this plan and all questions regarding their care were answered.  ED return precautions were discussed and  the patient will return to the ED with any significant worsening of condition.   Final Clinical Impression(s) / ED Diagnoses Final diagnoses:  Pneumonia due to COVID-19 virus  COVID-19    Rx / DC Orders ED Discharge Orders    None       Milagros Loll, MD 05/17/20 1254

## 2020-05-16 NOTE — Discharge Instructions (Addendum)
The CT scan was negative for a pulmonary embolism.  There is evidence of Covid pneumonia.  Continue taking Tylenol and Motrin as needed for fevers.  If you have any worsening of your breathing, chest pain or other new concerning symptom, please return to ER for reassessment.  Recommend scheduling a virtual appointment with your primary doctor for recheck in 24 to 48 hours.

## 2020-05-16 NOTE — ED Triage Notes (Signed)
Covid positive. Her symptoms started 10 days ago. She is here today for SOB.

## 2020-05-18 MED ORDER — AZITHROMYCIN 250 MG PO TABS: ORAL_TABLET | ORAL | 1 refills | Status: AC

## 2020-05-18 NOTE — Addendum Note (Signed)
Addended by: Quentin Mulling R on: 05/18/2020 08:24 AM   Modules accepted: Orders

## 2020-05-25 ENCOUNTER — Other Ambulatory Visit: Payer: Self-pay

## 2020-05-25 ENCOUNTER — Encounter: Payer: Self-pay | Admitting: Physician Assistant

## 2020-05-25 ENCOUNTER — Ambulatory Visit (INDEPENDENT_AMBULATORY_CARE_PROVIDER_SITE_OTHER): Payer: BC Managed Care – PPO | Admitting: Physician Assistant

## 2020-05-25 VITALS — BP 124/72 | HR 97 | Temp 97.7°F | Wt 214.4 lb

## 2020-05-25 DIAGNOSIS — E6609 Other obesity due to excess calories: Secondary | ICD-10-CM | POA: Diagnosis not present

## 2020-05-25 DIAGNOSIS — E559 Vitamin D deficiency, unspecified: Secondary | ICD-10-CM

## 2020-05-25 DIAGNOSIS — M329 Systemic lupus erythematosus, unspecified: Secondary | ICD-10-CM

## 2020-05-25 DIAGNOSIS — F3341 Major depressive disorder, recurrent, in partial remission: Secondary | ICD-10-CM

## 2020-05-25 DIAGNOSIS — R058 Other specified cough: Secondary | ICD-10-CM

## 2020-05-25 DIAGNOSIS — U071 COVID-19: Secondary | ICD-10-CM | POA: Diagnosis not present

## 2020-05-25 DIAGNOSIS — R7309 Other abnormal glucose: Secondary | ICD-10-CM

## 2020-05-25 DIAGNOSIS — Z79899 Other long term (current) drug therapy: Secondary | ICD-10-CM

## 2020-05-25 DIAGNOSIS — R05 Cough: Secondary | ICD-10-CM

## 2020-05-25 MED ORDER — BREO ELLIPTA 100-25 MCG/INH IN AEPB: 1.0000 | INHALATION_SPRAY | Freq: Every day | RESPIRATORY_TRACT | 0 refills | Status: DC

## 2020-05-25 NOTE — Progress Notes (Signed)
Patient ID: Jasmine Espinoza, female   DOB: 08-12-1986, 34 y.o.   MRN: [id number]   Assessment and Plan: Lupus (HCC) Continue medication and follow up with rheumatology  Obesity due to excess calories, unspecified classification, unspecified whether serious comorbidity present -     TSH - follow up 3 months for progress monitoring - increase veggies, decrease carbs - long discussion about weight loss, diet, and exercise  Depression, major, recurrent, in partial remission (HCC) - continue medications, stress management techniques discussed, increase water, good sleep hygiene discussed, increase exercise, and increase veggies.   Medication management -     CBC with Differential/Platelet -     COMPLETE METABOLIC PANEL WITH GFR -     Magnesium  Vitamin D deficiency -     VITAMIN D 25 Hydroxy (Vit-D Deficiency, Fractures)  Abnormal glucose -     Hemoglobin A1c Discussed disease progression and risks Discussed diet/exercise, weight management and risk modification  Post-viral cough syndrome s/p COVID Had antibody infusion, can not get vaccine for at least 90 days Will treat with breo, continue walking, get incentive spirometer.  -     fluticasone furoate-vilanterol (BREO ELLIPTA) 100-25 MCG/INH AEPB; Inhale 1 puff into the lungs daily. Rinse mouth with water after each use  Discussed med's effects and SE's. Screening labs and tests as requested with regular follow-up as recommended.  HPI  34 y.o. female  presents for a 6 month follow up from her CPE.   She has been following with rheumatology for her Raynauds, polyarthalgias, found to have SLE and started on plaquneil. She had COVID 05/11/20 and had an infusion 05/12/2020, went to the ER 08/09 with bilateral pnuemonia- had negative CTA for PE.    Her blood pressure has been controlled at home, today their BP is BP: 124/72 She does not workout right now since recovering from COVID.  She denies chest pain, shortness of breath,  dizziness.  She has raynauds, states has been worse with the winter/cold.  Hands and feet and would like to get on medication for me.  Patient is on Vitamin D supplement, she was on 50,000 IU has been on once a week.  Lab Results  Component Value Date   VD25OH 24 (L) 11/25/2019   BMI is Body mass index is 37.98 kg/m. Wt Readings from Last 3 Encounters:  05/25/20 214 lb 6.4 oz (97.3 kg)  05/16/20 225 lb 12 oz (102.4 kg)  02/04/20 225 lb 12.8 oz (102.4 kg)      Due to obesity, she has preDM, denies DM poly's.  Lab Results  Component Value Date   HGBA1C 5.3 11/25/2019   She has a 33 year old and a 34 year old, both girls, Jasmine Espinoza and Jasmine Espinoza, competitive dance, are in school daily at private school. She started new job, working from home, was working with her mom but they got into an argument 6-8 months ago.   She is on mirena which is helping  Lab Results  Component Value Date   CHOL 114 11/25/2019   HDL 34 (L) 11/25/2019   LDLCALC 64 11/25/2019   TRIG 82 11/25/2019   CHOLHDL 3.4 11/25/2019     Current Medications:  Current Outpatient Medications on File Prior to Visit  Medication Sig Dispense Refill  . benzonatate (TESSALON) 200 MG capsule Take 1 capsule (200 mg total) by mouth 3 (three) times daily as needed for cough (Max: 600mg  per day- not sedating). 30 capsule 0  . cetirizine (ZYRTEC) 5 MG tablet  Take 5 mg by mouth daily.    . famotidine (PEPCID) 40 MG tablet Once daily for nausea/GERD 30 tablet 1  . fluticasone (FLONASE) 50 MCG/ACT nasal spray Place 2 sprays into both nostrils daily. 16 g 0  . hydroxychloroquine (PLAQUENIL) 200 MG tablet TAKE ONE TABLET BY MOUTH TWICE DAILY 60 tablet 0  . ketoconazole (NIZORAL) 2 % cream Apply 1 application topically 2 (two) times daily. 30 g 1  . omeprazole (PRILOSEC) 40 MG capsule Take 1 capsule Daily for Indigestion & Heartburn 90 capsule 1  . ondansetron (ZOFRAN) 4 MG tablet Take 1 tablet (4 mg total) by mouth daily as needed for  nausea or vomiting (Not sedating). 30 tablet 1  . promethazine-dextromethorphan (PROMETHAZINE-DM) 6.25-15 MG/5ML syrup Take 5 mLs by mouth 4 (four) times daily as needed for cough (cough at night). 240 mL 1  . Vitamin D, Ergocalciferol, (DRISDOL) 1.25 MG (50000 UT) CAPS capsule 1 pill 3 days a week for 8 weeks for vitamin d deficiency 24 capsule 0   No current facility-administered medications on file prior to visit.    Medical History:  Past Medical History:  Diagnosis Date  . Abnormal Pap smear   . Hx of blood clots 2015   on uterus during pregnancy  . Lupus (HCC)   . Obesity (BMI 30-39.9)    BMI 38  . Postpartum depression 04/08/2014   Allergies No Known Allergies  SURGICAL HISTORY She  has a past surgical history that includes Wisdom tooth extraction and Colposcopy (2014). FAMILY HISTORY Her family history includes Cancer in her maternal grandmother; Heart disease in her paternal grandfather; Lupus in her paternal grandmother. SOCIAL HISTORY She  reports that she has never smoked. She has never used smokeless tobacco. She reports that she does not drink alcohol and does not use drugs.   Review of Systems  Constitutional: Negative for chills, diaphoresis, fever, malaise/fatigue and weight loss.  HENT: Negative.   Eyes: Negative.   Respiratory: Negative.  Negative for cough (better after being on GERD med) and shortness of breath.   Cardiovascular: Negative.  Negative for palpitations.  Gastrointestinal: Negative.   Genitourinary: Negative.   Musculoskeletal: Positive for joint pain (knee pain). Negative for back pain, falls, myalgias and neck pain.  Skin: Negative.   Neurological: Negative.  Negative for weakness.  Psychiatric/Behavioral: Negative.     Physical Exam: Estimated body mass index is 37.98 kg/m as calculated from the following:   Height as of 05/16/20: 5\' 3"  (1.6 m).   Weight as of this encounter: 214 lb 6.4 oz (97.3 kg). BP 124/72   Pulse 97   Temp 97.7  F (36.5 C)   Wt 214 lb 6.4 oz (97.3 kg)   SpO2 98%   BMI 37.98 kg/m  General Appearance: Well nourished, in no apparent distress.  Eyes: PERRLA, EOMs, conjunctiva no swelling or erythema, normal fundi and vessels.  Sinuses: No Frontal/maxillary tenderness  ENT/Mouth: Ext aud canals clear, normal light reflex with TMs without erythema, bulging. Good dentition. No erythema, swelling, or exudate on post pharynx. Tonsils not swollen or erythematous. Hearing normal.  Neck: Supple, thyroid fullness, left posterior lymph node enlargement. No bruits  Respiratory: Respiratory effort normal, BS equal bilaterally without rales, rhonchi, wheezing or stridor.  Cardio: RRR without murmurs, rubs or gallops. Brisk peripheral pulses without edema.  Chest: symmetric, with normal excursions and percussion.  Abdomen: Soft, nontender, obese, no guarding, rebound, hernias, masses, or organomegaly. . Lymphatics: no enlargement, nontender Musculoskeletal: Full ROM all peripheral  extremities,5/5 strength, and normal gait.  Skin: seb dermatitis on hair/forehead. Warm, dry without rashes, lesions, ecchymosis. Neuro: Cranial nerves intact, reflexes equal bilaterally. Normal muscle tone, no cerebellar symptoms. Sensation intact.  Psych: Awake and oriented X 3, normal affect, Insight and Judgment appropriate.    Quentin Mulling 9:08 AM [facility]

## 2020-05-25 NOTE — Patient Instructions (Addendum)
INFORMATION ABOUT YOUR STEROID INHALER  Can do steroid inhaler, NEED TO DO DAILY, this is NOT a rescue inhaler so if you are acutely short of breath please use your albuterol or call 911.  Do 1 puff ONCE a day.  Do before you brush your teeth OR wash your mouth afterwards.  IF YOU DO NOT WASH YOUR MOUTH OUT IT CAN CAUSE YEAST Can do 2 tsp vinegar with water and switch to help prevent yeast or help yeast in your mouth.   Go to the ER if any chest pain, shortness of breath, nausea, dizziness, severe HA, changes vision/speech  Do incentive spirometer at home, continue walking daily  CAll if any worsening symptoms

## 2020-05-26 LAB — COMPLETE METABOLIC PANEL WITH GFR
AG Ratio: 1 (calc) (ref 1.0–2.5)
ALT: 27 U/L (ref 6–29)
AST: 17 U/L (ref 10–30)
Albumin: 3.4 g/dL — ABNORMAL LOW (ref 3.6–5.1)
Alkaline phosphatase (APISO): 66 U/L (ref 31–125)
BUN: 10 mg/dL (ref 7–25)
CO2: 24 mmol/L (ref 20–32)
Calcium: 9 mg/dL (ref 8.6–10.2)
Chloride: 104 mmol/L (ref 98–110)
Creat: 0.66 mg/dL (ref 0.50–1.10)
GFR, Est African American: 135 mL/min/{1.73_m2} (ref >=60)
GFR, Est Non African American: 116 mL/min/{1.73_m2} (ref >=60)
Globulin: 3.5 g/dL (calc) (ref 1.9–3.7)
Glucose, Bld: 103 mg/dL — ABNORMAL HIGH (ref 65–99)
Potassium: 4.2 mmol/L (ref 3.5–5.3)
Sodium: 138 mmol/L (ref 135–146)
Total Bilirubin: 0.4 mg/dL (ref 0.2–1.2)
Total Protein: 6.9 g/dL (ref 6.1–8.1)

## 2020-05-26 LAB — MAGNESIUM: Magnesium: 2 mg/dL (ref 1.5–2.5)

## 2020-05-26 LAB — CBC WITH DIFFERENTIAL/PLATELET
Absolute Monocytes: 418 cells/uL (ref 200–950)
Basophils Absolute: 31 cells/uL (ref 0–200)
Basophils Relative: 0.7 %
Eosinophils Absolute: 62 cells/uL (ref 15–500)
Eosinophils Relative: 1.4 %
HCT: 41.5 % (ref 35.0–45.0)
Hemoglobin: 13.3 g/dL (ref 11.7–15.5)
Lymphs Abs: 1329 cells/uL (ref 850–3900)
MCH: 27.1 pg (ref 27.0–33.0)
MCHC: 32 g/dL (ref 32.0–36.0)
MCV: 84.7 fL (ref 80.0–100.0)
MPV: 10.1 fL (ref 7.5–12.5)
Monocytes Relative: 9.5 %
Neutro Abs: 2561 cells/uL (ref 1500–7800)
Neutrophils Relative %: 58.2 %
Platelets: 406 10*3/uL — ABNORMAL HIGH (ref 140–400)
RBC: 4.9 10*6/uL (ref 3.80–5.10)
RDW: 14 % (ref 11.0–15.0)
Total Lymphocyte: 30.2 %
WBC: 4.4 10*3/uL (ref 3.8–10.8)

## 2020-05-26 LAB — HEMOGLOBIN A1C
Hgb A1c MFr Bld: 5.5 % of total Hgb (ref <5.7)
Mean Plasma Glucose: 111 (calc)
eAG (mmol/L): 6.2 (calc)

## 2020-05-26 LAB — VITAMIN D 25 HYDROXY (VIT D DEFICIENCY, FRACTURES): Vit D, 25-Hydroxy: 58 ng/mL (ref 30–100)

## 2020-05-26 LAB — TSH: TSH: 2.66 mIU/L

## 2020-05-30 MED ORDER — VITAMIN D (ERGOCALCIFEROL) 1.25 MG (50000 UNIT) PO CAPS: ORAL_CAPSULE | ORAL | 0 refills | Status: DC

## 2020-06-09 ENCOUNTER — Telehealth: Payer: Self-pay | Admitting: *Deleted

## 2020-06-09 NOTE — Telephone Encounter (Signed)
Attempted to contact and left message for patient to call the office. Advised we were reaching out to check and see how she has been doing as she had Covid in August.

## 2020-06-10 ENCOUNTER — Other Ambulatory Visit: Payer: Self-pay | Admitting: Physician Assistant

## 2020-06-10 DIAGNOSIS — R059 Cough, unspecified: Secondary | ICD-10-CM

## 2020-06-15 ENCOUNTER — Other Ambulatory Visit: Payer: Self-pay | Admitting: Rheumatology

## 2020-06-15 DIAGNOSIS — M3219 Other organ or system involvement in systemic lupus erythematosus: Secondary | ICD-10-CM

## 2020-06-15 MED ORDER — HYDROXYCHLOROQUINE SULFATE 200 MG PO TABS: 200.0000 mg | ORAL_TABLET | Freq: Two times a day (BID) | ORAL | 0 refills | Status: DC

## 2020-06-15 NOTE — Telephone Encounter (Signed)
Patient called requesting prescription refill of Plaquenil.  Patient states she was diagnosed with COVID on 05/11/20 and is rescheduling her Plaquenil eye exam.

## 2020-06-15 NOTE — Telephone Encounter (Signed)
Last Visit: 02/04/2020 Next Visit: 05/12/2020 Labs: 02/04/2020 CBC and CMP WNL Eye exam: no baseline eye exam on file  Current Dose per office note 02/04/2020: Plaquenil 200 mg p.o. twice daily  DX: Other organ or system involvement in systemic lupus erythematosus   Patient rescheduling PLQ eye exam due to recent Covid.   Okay to refill Plaquenil?

## 2020-06-15 NOTE — Telephone Encounter (Signed)
I called patient.  She states she has an appointment with Dr. Mardella Layman in [facility] on 08/11/20.

## 2020-07-11 DIAGNOSIS — Z30431 Encounter for routine checking of intrauterine contraceptive device: Secondary | ICD-10-CM | POA: Diagnosis not present

## 2020-07-11 DIAGNOSIS — R1032 Left lower quadrant pain: Secondary | ICD-10-CM | POA: Diagnosis not present

## 2020-07-11 DIAGNOSIS — N83202 Unspecified ovarian cyst, left side: Secondary | ICD-10-CM | POA: Diagnosis not present

## 2020-08-01 ENCOUNTER — Encounter: Payer: Self-pay | Admitting: Rheumatology

## 2020-08-01 ENCOUNTER — Ambulatory Visit: Payer: BC Managed Care – PPO | Admitting: Rheumatology

## 2020-08-01 ENCOUNTER — Other Ambulatory Visit: Payer: Self-pay

## 2020-08-01 VITALS — BP 135/86 | HR 88 | Resp 14 | Ht 63.0 in | Wt 219.0 lb

## 2020-08-01 DIAGNOSIS — Z7189 Other specified counseling: Secondary | ICD-10-CM

## 2020-08-01 DIAGNOSIS — M3219 Other organ or system involvement in systemic lupus erythematosus: Secondary | ICD-10-CM | POA: Diagnosis not present

## 2020-08-01 DIAGNOSIS — U071 COVID-19: Secondary | ICD-10-CM

## 2020-08-01 DIAGNOSIS — Z79899 Other long term (current) drug therapy: Secondary | ICD-10-CM | POA: Diagnosis not present

## 2020-08-01 DIAGNOSIS — I7300 Raynaud's syndrome without gangrene: Secondary | ICD-10-CM

## 2020-08-01 DIAGNOSIS — M25562 Pain in left knee: Secondary | ICD-10-CM

## 2020-08-01 DIAGNOSIS — Z86718 Personal history of other venous thrombosis and embolism: Secondary | ICD-10-CM

## 2020-08-01 DIAGNOSIS — G8929 Other chronic pain: Secondary | ICD-10-CM

## 2020-08-01 DIAGNOSIS — Z8269 Family history of other diseases of the musculoskeletal system and connective tissue: Secondary | ICD-10-CM

## 2020-08-01 DIAGNOSIS — E559 Vitamin D deficiency, unspecified: Secondary | ICD-10-CM

## 2020-08-01 DIAGNOSIS — J302 Other seasonal allergic rhinitis: Secondary | ICD-10-CM

## 2020-08-01 DIAGNOSIS — Z8719 Personal history of other diseases of the digestive system: Secondary | ICD-10-CM

## 2020-08-01 DIAGNOSIS — M25561 Pain in right knee: Secondary | ICD-10-CM

## 2020-08-01 NOTE — Progress Notes (Signed)
Office Visit Note  Patient: Jasmine Espinoza             Date of Birth: September 06, 1986           MRN: [id number]             PCP: Lucky Cowboy, MD Referring: Lucky Cowboy, MD Visit Date: 08/01/2020 Occupation: @GUAROCC @  Subjective:  Other (bilateral knee pain )   History of Present Illness: Jasmine Espinoza is a 34 y.o. female with history of systemic lupus erythematosus.  She was a started on Plaquenil in April 2021.  In July she acquired COVID-19 infection.  She states she had pneumonia and was very sick.  She took her a while to recover from it.  She continues to take Plaquenil.  She continues to have some discomfort in her knee joints.  None of the other joints are painful.  She denies any shortness of breath now.  She has not had her baseline eye examination due to COVID-19 infection.  She is scheduled to have it in November.  Activities of Daily Living:  Patient reports morning stiffness for 15-20  minutes.   Patient Denies nocturnal pain.  Difficulty dressing/grooming: Denies Difficulty climbing stairs: Denies Difficulty getting out of chair: Denies Difficulty using hands for taps, buttons, cutlery, and/or writing: Denies  Review of Systems  Constitutional: Negative for fatigue.  HENT: Negative for mouth sores, mouth dryness and nose dryness.   Eyes: Positive for itching. Negative for pain and dryness.  Respiratory: Negative for shortness of breath and difficulty breathing.   Cardiovascular: Negative for chest pain and palpitations.  Gastrointestinal: Negative for blood in stool, constipation and diarrhea.  Endocrine: Negative for increased urination.  Genitourinary: Negative for difficulty urinating and painful urination.  Musculoskeletal: Positive for arthralgias, joint pain and morning stiffness. Negative for joint swelling, myalgias, muscle tenderness and myalgias.  Skin: Positive for color change and hair loss. Negative for rash.  Allergic/Immunologic:  Negative for susceptible to infections.  Neurological: Positive for headaches. Negative for dizziness, numbness, memory loss and weakness.  Hematological: Positive for bruising/bleeding tendency.  Psychiatric/Behavioral: Negative for confusion and sleep disturbance.    PMFS History:  Patient Active Problem List   Diagnosis Date Noted  . Lupus (HCC) 05/25/2020  . Depression, major, recurrent, in partial remission (HCC) 05/25/2020  . Obesity 07/27/2014  . Postpartum depression 04/08/2014    Past Medical History:  Diagnosis Date  . Abnormal Pap smear   . Hx of blood clots 2015   on uterus during pregnancy  . Lupus (HCC)   . Obesity (BMI 30-39.9)    BMI 38  . Postpartum depression 04/08/2014    Family History  Problem Relation Age of Onset  . Cancer Maternal Grandmother        breast, late 38s  . Heart disease Paternal Grandfather        does not know him, died when she was42  . Lupus Paternal Grandmother    Past Surgical History:  Procedure Laterality Date  . COLPOSCOPY  2014  . WISDOM TOOTH EXTRACTION     Social History   Social History Narrative  . Not on file   Immunization History  Administered Date(s) Administered  . Influenza Split 08/01/2015  . Influenza-Unspecified 07/09/2016  . PPD Test 02/02/2015, 03/15/2015, 04/19/2016  . Tdap 04/24/2013     Objective: Vital Signs: BP 135/86 (BP Location: Left Arm, Patient Position: Sitting, Cuff Size: Normal)   Pulse 88   Resp 14   Ht  5\' 3"  (1.6 m)   Wt 219 lb (99.3 kg)   BMI 38.79 kg/m    Physical Exam Vitals and nursing note reviewed.  Constitutional:      Appearance: She is well-developed.  HENT:     Head: Normocephalic and atraumatic.  Eyes:     Conjunctiva/sclera: Conjunctivae normal.  Cardiovascular:     Rate and Rhythm: Normal rate and regular rhythm.     Heart sounds: Normal heart sounds.  Pulmonary:     Effort: Pulmonary effort is normal.     Breath sounds: Normal breath sounds.  Abdominal:      General: Bowel sounds are normal.     Palpations: Abdomen is soft.  Musculoskeletal:     Cervical back: Normal range of motion.  Lymphadenopathy:     Cervical: No cervical adenopathy.  Skin:    General: Skin is warm and dry.     Capillary Refill: Capillary refill takes less than 2 seconds.     Comments: Hair thinning noted  Neurological:     Mental Status: She is alert and oriented to person, place, and time.  Psychiatric:        Behavior: Behavior normal.      Musculoskeletal Exam: C-spine thoracic and lumbar spine were in good range of motion.  Shoulder joints, elbow joints, wrist joints, MCPs PIPs and DIPs with good range of motion with no synovitis.  Hip joints, knee joints, ankles, MTPs and PIPs with good range of motion with no synovitis.  CDAI Exam: CDAI Score: -- Patient Global: --; Provider Global: -- Swollen: --; Tender: -- Joint Exam 08/01/2020   No joint exam has been documented for this visit   There is currently no information documented on the homunculus. Go to the Rheumatology activity and complete the homunculus joint exam.  Investigation: No additional findings.  Imaging: No results found.  Recent Labs: Lab Results  Component Value Date   WBC 4.4 05/25/2020   HGB 13.3 05/25/2020   PLT 406 (H) 05/25/2020   NA 138 05/25/2020   K 4.2 05/25/2020   CL 104 05/25/2020   CO2 24 05/25/2020   GLUCOSE 103 (H) 05/25/2020   BUN 10 05/25/2020   CREATININE 0.66 05/25/2020   BILITOT 0.4 05/25/2020   ALKPHOS 91 08/01/2016   AST 17 05/25/2020   ALT 27 05/25/2020   PROT 6.9 05/25/2020   ALBUMIN 3.9 08/01/2016   CALCIUM 9.0 05/25/2020   GFRAA 135 05/25/2020   QFTBGOLDPLUS NEGATIVE 01/11/2020   May 25, 2020 vitamin D 58 Speciality Comments: No specialty comments available.  Procedures:  No procedures performed Allergies: Patient has no known allergies.   Assessment / Plan:     Visit Diagnoses: Other organ or system involvement in systemic lupus  erythematosus (HCC) - fatigue, arthritis, malar rash, Raynaud's, photosensitivity, +ANA, +Smith, + RNP, + Ro, history of DVT during pregnancy, family history of lupus.  She is clinically doing well as regards to systemic lupus.  She had no warmth swelling or effusion in her joints.  Despite having COVID-19 infection she continues to take hydroxychloroquine.  High risk medication use-she is currently on hydroxychloroquine 200 mg p.o. twice daily.  If she does well over the next year we may reduce her to Monday to Friday dosing.  Her labs are due.  Her last labs were in April.  We will check labs today.  She has not had a baseline eye examination yet.  She states she is scheduled in November.  Risk of ocular toxicity  was emphasized.  Raynaud's syndrome without gangrene-currently not active.  History of blood clots - DVT 13 and 2014 during pregnancy.  Anticardiolipin, beta-2 GP 1 and lupus anticoagulant were negative.  Chronic pain of both knees-she complains of discomfort in her knee joints.  No warmth swelling or effusion was noted.  Vitamin D deficiency-followed by her PCP.  Her vitamin D was in desirable range.  She has been taking vitamin D 50,000 units once a week.  History of gastroesophageal reflux (GERD)  Seasonal allergies  Family history of systemic lupus erythematosus - Paternal grandmother  COVID-19 virus infection-July 2021.  She had pneumonia which was treated.  She has no residual symptoms.  Educated about COVID-19 virus infection-she has not received her vaccination yet.  She should be able to receive vaccination treatments after the infection.  Have advised her to discuss this further with her PCP.  Use of mask, social distancing and hand hygiene was discussed.  Use of monoclonal antibodies were also discussed in case she develops COVID-19 infection.  All the instructions placed in the AVS.  Orders: No orders of the defined types were placed in this encounter.  No orders of  the defined types were placed in this encounter.    Follow-Up Instructions: Return in about 3 months (around 11/01/2020) for Systemic lupus.   Pollyann Savoy, MD  Note - This record has been created using Animal nutritionist.  Chart creation errors have been sought, but may not always  have been located. Such creation errors do not reflect on  the standard of medical care.

## 2020-08-01 NOTE — Patient Instructions (Signed)
COVID-19 vaccine recommendations:   COVID-19 vaccine is recommended for everyone (unless you are allergic to a vaccine component), even if you are on a medication that suppresses your immune system.   Do not take Tylenol or any anti-inflammatory medications (NSAIDs) 24 hours prior to the COVID-19 vaccination.   There is no direct evidence about the efficacy of the COVID-19 vaccine in individuals who are on medications that suppress the immune system.   Even if you are fully vaccinated, and you are on any medications that suppress your immune system, please continue to wear a mask, maintain at least six feet social distance and practice hand hygiene.   If you develop a COVID-19 infection, please contact your PCP or our office to determine if you need antibody infusion.  The booster vaccine is now available for immunocompromised patients.   Please see the following web sites for updated information.   Standing Labs We placed an order today for your standing lab work.   Please have your standing labs drawn in January and every 3 months  If possible, please have your labs drawn 2 weeks prior to your appointment so that the provider can discuss your results at your appointment.  We have open lab daily Monday through Thursday from 8:30-12:30 PM and 1:30-4:30 PM and Friday from 8:30-12:30 PM and 1:30-4:00 PM at the office of Dr. Pollyann Savoy, [facility].   Please be advised, patients with office appointments requiring lab work will take precedents over walk-in lab work.  If possible, please come for your lab work on Monday and Friday afternoons, as you may experience shorter wait times. The office is located at [address], [address], [place], [place] [address] No appointment is necessary.   Labs are drawn by [facility]. Please bring your co-pay at the time of your lab draw.  You may receive a bill from [facility] for your lab work.  If you wish to have your labs drawn at another  location, please call the office 24 hours in advance to send orders.  If you have any questions regarding directions or hours of operation,  please call [phone].   As a reminder, please drink plenty of water prior to coming for your lab work. Thanks!

## 2020-08-02 LAB — URINALYSIS, ROUTINE W REFLEX MICROSCOPIC
Bacteria, UA: NONE SEEN /HPF
Bilirubin Urine: NEGATIVE
Glucose, UA: NEGATIVE
Hgb urine dipstick: NEGATIVE
Hyaline Cast: NONE SEEN /LPF
Ketones, ur: NEGATIVE
Nitrite: NEGATIVE
Protein, ur: NEGATIVE
RBC / HPF: NONE SEEN /HPF (ref 0–2)
Specific Gravity, Urine: 1.018 (ref 1.001–1.03)
pH: 7 (ref 5.0–8.0)

## 2020-08-02 LAB — CBC WITH DIFFERENTIAL/PLATELET
Absolute Monocytes: 507 cells/uL (ref 200–950)
Basophils Absolute: 9 cells/uL (ref 0–200)
Basophils Relative: 0.2 %
Eosinophils Absolute: 30 cells/uL (ref 15–500)
Eosinophils Relative: 0.7 %
HCT: 43.2 % (ref 35.0–45.0)
Hemoglobin: 13.6 g/dL (ref 11.7–15.5)
Lymphs Abs: 1127 cells/uL (ref 850–3900)
MCH: 27.6 pg (ref 27.0–33.0)
MCHC: 31.5 g/dL — ABNORMAL LOW (ref 32.0–36.0)
MCV: 87.6 fL (ref 80.0–100.0)
MPV: 11 fL (ref 7.5–12.5)
Monocytes Relative: 11.8 %
Neutro Abs: 2627 cells/uL (ref 1500–7800)
Neutrophils Relative %: 61.1 %
Platelets: 253 10*3/uL (ref 140–400)
RBC: 4.93 10*6/uL (ref 3.80–5.10)
RDW: 14.2 % (ref 11.0–15.0)
Total Lymphocyte: 26.2 %
WBC: 4.3 10*3/uL (ref 3.8–10.8)

## 2020-08-02 LAB — COMPLETE METABOLIC PANEL WITH GFR
AG Ratio: 1.3 (calc) (ref 1.0–2.5)
ALT: 13 U/L (ref 6–29)
AST: 13 U/L (ref 10–30)
Albumin: 4.1 g/dL (ref 3.6–5.1)
Alkaline phosphatase (APISO): 73 U/L (ref 31–125)
BUN: 8 mg/dL (ref 7–25)
CO2: 26 mmol/L (ref 20–32)
Calcium: 9.3 mg/dL (ref 8.6–10.2)
Chloride: 107 mmol/L (ref 98–110)
Creat: 0.62 mg/dL (ref 0.50–1.10)
GFR, Est African American: 137 mL/min/{1.73_m2} (ref >=60)
GFR, Est Non African American: 118 mL/min/{1.73_m2} (ref >=60)
Globulin: 3.2 g/dL (calc) (ref 1.9–3.7)
Glucose, Bld: 87 mg/dL (ref 65–99)
Potassium: 4.2 mmol/L (ref 3.5–5.3)
Sodium: 138 mmol/L (ref 135–146)
Total Bilirubin: 0.3 mg/dL (ref 0.2–1.2)
Total Protein: 7.3 g/dL (ref 6.1–8.1)

## 2020-08-02 LAB — C3 AND C4
C3 Complement: 115 mg/dL (ref 83–193)
C4 Complement: 28 mg/dL (ref 15–57)

## 2020-08-02 LAB — SEDIMENTATION RATE: Sed Rate: 6 mm/h (ref 0–20)

## 2020-08-02 LAB — ANTI-DNA ANTIBODY, DOUBLE-STRANDED: ds DNA Ab: 1 IU/mL

## 2020-08-02 NOTE — Progress Notes (Signed)
Labs are stable and do not indicate active autoimmune disease.  No change in therapy required.

## 2020-09-06 ENCOUNTER — Other Ambulatory Visit: Payer: Self-pay

## 2020-09-06 DIAGNOSIS — E559 Vitamin D deficiency, unspecified: Secondary | ICD-10-CM

## 2020-09-06 MED ORDER — VITAMIN D (ERGOCALCIFEROL) 1.25 MG (50000 UNIT) PO CAPS: ORAL_CAPSULE | ORAL | 1 refills | Status: DC

## 2020-09-06 MED ORDER — VITAMIN D (ERGOCALCIFEROL) 1.25 MG (50000 UNIT) PO CAPS: ORAL_CAPSULE | ORAL | 0 refills | Status: DC

## 2020-09-22 ENCOUNTER — Other Ambulatory Visit: Payer: Self-pay | Admitting: *Deleted

## 2020-09-22 DIAGNOSIS — M3219 Other organ or system involvement in systemic lupus erythematosus: Secondary | ICD-10-CM

## 2020-09-22 MED ORDER — HYDROXYCHLOROQUINE SULFATE 200 MG PO TABS: 200.0000 mg | ORAL_TABLET | Freq: Two times a day (BID) | ORAL | 0 refills | Status: DC

## 2020-09-22 NOTE — Telephone Encounter (Signed)
Refill request received via fax  Last Visit: 08/01/2020 Next Visit: 11/01/2020 Labs: 08/01/2020 Labs are stable and do not indicate active autoimmune disease. No change in therapy required. PLQ Eye Exam: no baseline on file  Current Dose per office note on 08/01/2020:  hydroxychloroquine 200 mg p.o. twice daily Dx:  Other organ or system involvement in systemic lupus erythematosus   Patient advised she is due for her PLQ eye exam. Patient states she is scheduled for PLQ eye exam 10/21/2020.   Okay to refill PLQ?

## 2020-09-22 NOTE — Telephone Encounter (Signed)
Refill request received via fax  Last Visit: 08/01/2020 Next Visit: 11/01/2020 Labs: 08/01/2020 Labs are stable and do not indicate active autoimmune disease. No change in therapy required. PLQ Eye Exam: no baseline on file  Current Dose per office note on 08/01/2020:  hydroxychloroquine 200 mg p.o. twice daily Dx:  Other organ or system involvement in systemic lupus erythematosus   Patient advised she is due for her PLQ eye exam. Patient states she is scheduled for PLQ eye exam 10/21/2020.   Okay to refill PLQ?  

## 2020-09-29 DIAGNOSIS — Z01419 Encounter for gynecological examination (general) (routine) without abnormal findings: Secondary | ICD-10-CM | POA: Diagnosis not present

## 2020-09-29 DIAGNOSIS — Z6838 Body mass index (BMI) 38.0-38.9, adult: Secondary | ICD-10-CM | POA: Diagnosis not present

## 2020-09-29 DIAGNOSIS — R1032 Left lower quadrant pain: Secondary | ICD-10-CM | POA: Diagnosis not present

## 2020-10-18 ENCOUNTER — Encounter: Payer: BLUE CROSS/BLUE SHIELD | Admitting: Physician Assistant

## 2020-10-18 NOTE — Progress Notes (Signed)
Office Visit Note  Patient: Jasmine Espinoza             Date of Birth: 09/15/1986           MRN: [id number]             PCP: Unk Pinto, MD Referring: Unk Pinto, MD Visit Date: 11/01/2020 Occupation: @GUAROCC @  Subjective:  Hair loss  History of Present Illness: Jasmine Espinoza is a 35 y.o. female with history of systemic lupus erythematosus.  She is taking plaquenil 200 mg 1 tablet by mouth daily. Patient reports that she reduce the dose of Plaquenil from twice daily to once daily about 2 months ago. She has been tolerating Plaquenil without any side effects and has not developed any new or worsening symptoms since reducing the dose of Plaquenil. She denies any recent lupus flares. She denies any increased joint pain or joint swelling but has noticed some increased joint stiffness. She has not had any recent rashes. She states that she had noticed increased hair loss after being diagnosed with the COVID-19 infection in September 2021. She has ongoing nose dryness but has not had any mouth dryness or eye dryness recently. She denies any oral or nasal ulcerations. She has had intermittent symptoms of Raynaud's but denies any digital ulcerations. She continues to experience intermittent swollen cervical lymph nodes but denies any recent fevers or infections.     Activities of Daily Living:  Patient reports morning stiffness for 10-15 minutes.   Patient Reports nocturnal pain.  Difficulty dressing/grooming: Denies Difficulty climbing stairs: Reports Difficulty getting out of chair: Denies Difficulty using hands for taps, buttons, cutlery, and/or writing: Denies  Review of Systems  Constitutional: Negative for fatigue.  HENT: Positive for nose dryness. Negative for mouth dryness.   Eyes: Positive for itching. Negative for pain, visual disturbance and dryness.  Respiratory: Negative for cough, hemoptysis, shortness of breath and difficulty breathing.   Cardiovascular:  Negative for chest pain, palpitations and swelling in legs/feet.  Gastrointestinal: Positive for blood in stool and constipation. Negative for abdominal pain and diarrhea.  Endocrine: Negative for increased urination.  Genitourinary: Negative for painful urination.  Musculoskeletal: Positive for arthralgias, joint pain and morning stiffness. Negative for joint swelling, myalgias, muscle weakness, muscle tenderness and myalgias.  Skin: Positive for color change. Negative for rash and redness.  Allergic/Immunologic: Negative for susceptible to infections.  Neurological: Positive for headaches. Negative for dizziness, numbness, memory loss and weakness.  Hematological: Positive for swollen glands.  Psychiatric/Behavioral: Negative for confusion and sleep disturbance.    PMFS History:  Patient Active Problem List   Diagnosis Date Noted  . Lupus ([place]) 05/25/2020  . Depression, major, recurrent, in partial remission ([place]) 05/25/2020  . Obesity 07/27/2014  . Postpartum depression 04/08/2014    Past Medical History:  Diagnosis Date  . Abnormal Pap smear   . Hx of blood clots 2015   on uterus during pregnancy  . Lupus ([place])   . Obesity (BMI 30-39.9)    BMI 38  . Postpartum depression 04/08/2014    Family History  Problem Relation Age of Onset  . Cancer Maternal Grandmother        breast, late 57s  . Heart disease Paternal Grandfather        does not know him, died when she was21  . Lupus Paternal Grandmother    Past Surgical History:  Procedure Laterality Date  . COLPOSCOPY  2014  . [place] EXTRACTION     Social  History   Social History Narrative  . Not on file   Immunization History  Administered Date(s) Administered  . Influenza Split 08/01/2015  . Influenza-Unspecified 07/09/2016  . PPD Test 02/02/2015, 03/15/2015, 04/19/2016  . Tdap 04/24/2013     Objective: Vital Signs: BP 120/81 (BP Location: Right Arm, Patient Position: Sitting, Cuff Size: Large)   Pulse 80    Ht 5\' 3"  (1.6 m)   Wt 220 lb 3.2 oz (99.9 kg)   BMI 39.01 kg/m    Physical Exam Vitals and nursing note reviewed.  Constitutional:      Appearance: She is well-developed and well-nourished.  HENT:     Head: Normocephalic and atraumatic.  Eyes:     Extraocular Movements: EOM normal.     Conjunctiva/sclera: Conjunctivae normal.  Cardiovascular:     Pulses: Intact distal pulses.  Pulmonary:     Effort: Pulmonary effort is normal.  Abdominal:     Palpations: Abdomen is soft.  Musculoskeletal:     Cervical back: Normal range of motion.  Lymphadenopathy:     Cervical: Cervical adenopathy present.  Skin:    General: Skin is warm and dry.     Capillary Refill: Capillary refill takes less than 2 seconds.  Neurological:     Mental Status: She is alert and oriented to person, place, and time.  Psychiatric:        Mood and Affect: Mood and affect normal.        Behavior: Behavior normal.      Musculoskeletal Exam: C-spine, thoracic spine, lumbar spine have good range of motion. Shoulder joints, elbow joints, wrist joints, MCPs, PIPs, DIPs have good range of motion with no synovitis. She is able to make a complete fist bilaterally. Hip joints have good range of motion with no discomfort. Knee joints have good range of motion with no warmth or effusion. Ankle joints have good range of motion with no tenderness or inflammation. No tenderness over trochanteric bursa bilaterally.  CDAI Exam: CDAI Score: -- Patient Global: --; Provider Global: -- Swollen: --; Tender: -- Joint Exam 11/01/2020   No joint exam has been documented for this visit   There is currently no information documented on the homunculus. Go to the Rheumatology activity and complete the homunculus joint exam.  Investigation: No additional findings.  Imaging: No results found.  Recent Labs: Lab Results  Component Value Date   WBC 4.3 08/01/2020   HGB 13.6 08/01/2020   PLT 253 08/01/2020   NA 138 08/01/2020    K 4.2 08/01/2020   CL 107 08/01/2020   CO2 26 08/01/2020   GLUCOSE 87 08/01/2020   BUN 8 08/01/2020   CREATININE 0.62 08/01/2020   BILITOT 0.3 08/01/2020   ALKPHOS 91 08/01/2016   AST 13 08/01/2020   ALT 13 08/01/2020   PROT 7.3 08/01/2020   ALBUMIN 3.9 08/01/2016   CALCIUM 9.3 08/01/2020   GFRAA 137 08/01/2020   QFTBGOLDPLUS NEGATIVE 01/11/2020    Speciality Comments: PLQ Eye Exam: 10/21/2020 WNL @ [facility] 10/23/2020 [place] Follow up in 12 months  Procedures:  No procedures performed Allergies: Patient has no known allergies.     Assessment / Plan:     Visit Diagnoses: Other organ or system involvement in systemic lupus erythematosus (HCC) - fatigue, arthritis, malar rash, Raynaud's, photosensitivity, +ANA, +Smith, + RNP, + Ro, history of DVT during pregnancy, family history of lupus:  She has not had any signs or symptoms of a systemic lupus flare recently. She is  clinically doing well taking Plaquenil 200 mg 1 tablet by mouth daily. She reduced the dose of Plaquenil from twice daily to once daily about 2 months ago and has not noticed any new or worsening symptoms. She is tolerating Plaquenil without any side effects. She has no joint tenderness or synovitis on exam. She has noticed some increased joint stiffness, so we discussed the importance of regular exercise and muscle strengthening. She has had intermittent symptoms of Raynaud's with the cooler weather temperatures but no cyanosis, gangrene, or digital ulcerations were noted on examination today. She has ongoing nose dryness but has not had any increased mouth dryness or eye dryness. She has no nasal or oral ulcerations. On exam, one cervical lymph node was palpable left posterior chain which was non tender. She has not had any recent fevers or infections. She has not had any chest pain, palpitations, or shortness of breath. Lab work from 08/01/20 was reviewed today: dsDNA negative, complements WNL, and ESR WNL. We will check the  following lab work today since she recently reduced the dose of PLQ. She will continue taking Plaquenil 200 mg 1 tablet by mouth daily. She was advised to notify us if she develops any signs or symptoms of a flare. She will follow-up in the office in 3 months. - Plan: CBC with Differential/Platelet, COMPLETE METABOLIC PANEL WITH GFR, Urinalysis, Routine w reflex microscopic, Anti-DNA antibody, double-stranded, Sedimentation rate, C3 and C4  High risk medication use - Plaquenil 200 mg 1 tablet by mouth daily. PLQ Eye Exam: 10/21/2020 WNL @ [facility] [place] Follow up in 12 months.  CBC and CMP updated on 08/01/20.  CBC and CMP updated today.  - Plan: CBC with Differential/Platelet, COMPLETE METABOLIC PANEL WITH GFR  Raynaud's syndrome without gangrene: She has intermittent symptoms of raynaud's, which has been more frequent with cooler weather temperatures.  No skin thickening or tightness noted.  No cyanosis, digital ulcerations, or signs of gangrene noted on exam.   Hair loss: She has noticed increased hair loss after being diagnosed with Covid-19 in September 2021.  Discussed the use of biotin or Nutrafol to improve hair strength and growth.   History of blood clots - DVT 13 and 2014 during pregnancy.  Anticardiolipin, beta-2 GP 1 and lupus anticoagulant were negative.  Chronic pain of both knees: X-rays of both knees were unremarkable on 01/11/20.  She has good ROM of both knee joints on exam with no warmth or effusion.  She experiences intermittent stiffness in both knees especially after sitting for a prolonged period of time.  Discussed the importance of lower extremity muscle strengthening and regular exercise.   Vitamin D deficiency - Followed by her PCP. Vitamin D was 58 on 05/25/20. She is taking vitamin D 50,000 units once weekly.   Other medical conditions are listed as follows:   Seasonal allergies  History of gastroesophageal reflux (GERD)  Family history of systemic lupus  erythematosus - Paternal grandmother  Orders: Orders Placed This Encounter  Procedures  . CBC with Differential/Platelet  . COMPLETE METABOLIC PANEL WITH GFR  . Urinalysis, Routine w reflex microscopic  . Anti-DNA antibody, double-stranded  . Sedimentation rate  . C3 and C4   No orders of the defined types were placed in this encounter.   Follow-Up Instructions: Return in about 3 months (around 01/30/2021) for Systemic lupus erythematosus.   Ofilia Neas, PA-C  Note - This record has been created using Dragon software.  Chart creation errors have been  sought, but may not always  have been located. Such creation errors do not reflect on  the standard of medical care.

## 2020-10-21 DIAGNOSIS — M329 Systemic lupus erythematosus, unspecified: Secondary | ICD-10-CM | POA: Diagnosis not present

## 2020-10-21 DIAGNOSIS — Z79899 Other long term (current) drug therapy: Secondary | ICD-10-CM | POA: Diagnosis not present

## 2020-11-01 ENCOUNTER — Encounter: Payer: Self-pay | Admitting: Physician Assistant

## 2020-11-01 ENCOUNTER — Other Ambulatory Visit: Payer: Self-pay

## 2020-11-01 ENCOUNTER — Ambulatory Visit: Payer: BC Managed Care – PPO | Admitting: Physician Assistant

## 2020-11-01 VITALS — BP 120/81 | HR 80 | Ht 63.0 in | Wt 220.2 lb

## 2020-11-01 DIAGNOSIS — M3219 Other organ or system involvement in systemic lupus erythematosus: Secondary | ICD-10-CM | POA: Diagnosis not present

## 2020-11-01 DIAGNOSIS — Z86718 Personal history of other venous thrombosis and embolism: Secondary | ICD-10-CM

## 2020-11-01 DIAGNOSIS — Z8719 Personal history of other diseases of the digestive system: Secondary | ICD-10-CM

## 2020-11-01 DIAGNOSIS — Z79899 Other long term (current) drug therapy: Secondary | ICD-10-CM | POA: Diagnosis not present

## 2020-11-01 DIAGNOSIS — G8929 Other chronic pain: Secondary | ICD-10-CM

## 2020-11-01 DIAGNOSIS — L659 Nonscarring hair loss, unspecified: Secondary | ICD-10-CM

## 2020-11-01 DIAGNOSIS — J302 Other seasonal allergic rhinitis: Secondary | ICD-10-CM

## 2020-11-01 DIAGNOSIS — M25562 Pain in left knee: Secondary | ICD-10-CM

## 2020-11-01 DIAGNOSIS — Z8269 Family history of other diseases of the musculoskeletal system and connective tissue: Secondary | ICD-10-CM

## 2020-11-01 DIAGNOSIS — I7300 Raynaud's syndrome without gangrene: Secondary | ICD-10-CM | POA: Diagnosis not present

## 2020-11-01 DIAGNOSIS — M25561 Pain in right knee: Secondary | ICD-10-CM

## 2020-11-01 DIAGNOSIS — E559 Vitamin D deficiency, unspecified: Secondary | ICD-10-CM

## 2020-11-01 MED ORDER — HYDROXYCHLOROQUINE SULFATE 200 MG PO TABS: 200.0000 mg | ORAL_TABLET | Freq: Every day | ORAL | 0 refills | Status: DC

## 2020-11-01 NOTE — Addendum Note (Signed)
Addended by: Ellen Henri on: 11/01/2020 12:26 PM   Modules accepted: Orders

## 2020-11-02 LAB — C3 AND C4
C3 Complement: 116 mg/dL (ref 83–193)
C4 Complement: 30 mg/dL (ref 15–57)

## 2020-11-02 LAB — COMPLETE METABOLIC PANEL WITH GFR
AG Ratio: 1.2 (calc) (ref 1.0–2.5)
ALT: 12 U/L (ref 6–29)
AST: 13 U/L (ref 10–30)
Albumin: 4.2 g/dL (ref 3.6–5.1)
Alkaline phosphatase (APISO): 83 U/L (ref 31–125)
BUN: 11 mg/dL (ref 7–25)
CO2: 26 mmol/L (ref 20–32)
Calcium: 9.3 mg/dL (ref 8.6–10.2)
Chloride: 105 mmol/L (ref 98–110)
Creat: 0.71 mg/dL (ref 0.50–1.10)
GFR, Est African American: 130 mL/min/{1.73_m2} (ref >=60)
GFR, Est Non African American: 112 mL/min/{1.73_m2} (ref >=60)
Globulin: 3.4 g/dL (calc) (ref 1.9–3.7)
Glucose, Bld: 83 mg/dL (ref 65–99)
Potassium: 4.4 mmol/L (ref 3.5–5.3)
Sodium: 140 mmol/L (ref 135–146)
Total Bilirubin: 0.4 mg/dL (ref 0.2–1.2)
Total Protein: 7.6 g/dL (ref 6.1–8.1)

## 2020-11-02 LAB — URINALYSIS, ROUTINE W REFLEX MICROSCOPIC
Bilirubin Urine: NEGATIVE
Glucose, UA: NEGATIVE
Hgb urine dipstick: NEGATIVE
Ketones, ur: NEGATIVE
Leukocytes,Ua: NEGATIVE
Nitrite: NEGATIVE
Protein, ur: NEGATIVE
Specific Gravity, Urine: 1.027 (ref 1.001–1.03)
pH: 6.5 (ref 5.0–8.0)

## 2020-11-02 LAB — CBC WITH DIFFERENTIAL/PLATELET
Absolute Monocytes: 458 cells/uL (ref 200–950)
Basophils Absolute: 31 cells/uL (ref 0–200)
Basophils Relative: 0.7 %
Eosinophils Absolute: 40 cells/uL (ref 15–500)
Eosinophils Relative: 0.9 %
HCT: 42.5 % (ref 35.0–45.0)
Hemoglobin: 13.8 g/dL (ref 11.7–15.5)
Lymphs Abs: 1082 cells/uL (ref 850–3900)
MCH: 27.5 pg (ref 27.0–33.0)
MCHC: 32.5 g/dL (ref 32.0–36.0)
MCV: 84.7 fL (ref 80.0–100.0)
MPV: 10.6 fL (ref 7.5–12.5)
Monocytes Relative: 10.4 %
Neutro Abs: 2790 cells/uL (ref 1500–7800)
Neutrophils Relative %: 63.4 %
Platelets: 253 10*3/uL (ref 140–400)
RBC: 5.02 10*6/uL (ref 3.80–5.10)
RDW: 13.1 % (ref 11.0–15.0)
Total Lymphocyte: 24.6 %
WBC: 4.4 10*3/uL (ref 3.8–10.8)

## 2020-11-02 LAB — ANTI-DNA ANTIBODY, DOUBLE-STRANDED: ds DNA Ab: 1 IU/mL

## 2020-11-02 LAB — SEDIMENTATION RATE: Sed Rate: 9 mm/h (ref 0–20)

## 2020-11-02 NOTE — Progress Notes (Signed)
dsDNA is negative.  Labs are not consistent with a flare.

## 2020-11-02 NOTE — Progress Notes (Signed)
CBC and CMP WNL.  Complements and ESR WNL. UA normal.  dsDNA pending.  Please advise the patient to continue taking plaquenil 200 mg 1 tablet by mouth daily.

## 2020-11-24 ENCOUNTER — Encounter: Payer: BC Managed Care – PPO | Admitting: Adult Health Nurse Practitioner

## 2020-12-08 ENCOUNTER — Telehealth: Payer: Self-pay

## 2020-12-08 ENCOUNTER — Other Ambulatory Visit: Payer: Self-pay

## 2020-12-08 DIAGNOSIS — M3219 Other organ or system involvement in systemic lupus erythematosus: Secondary | ICD-10-CM

## 2020-12-08 MED ORDER — HYDROXYCHLOROQUINE SULFATE 200 MG PO TABS: 200.0000 mg | ORAL_TABLET | Freq: Every day | ORAL | 0 refills | Status: DC

## 2020-12-08 NOTE — Telephone Encounter (Signed)
Last Visit: 11/01/2020 Next Visit: 01/31/2021 Labs: 11/01/2020 Eye exam: 10/21/2020 WNL   Current Dose per office note 11/01/2020: Plaquenil 200 mg 1 tablet by mouth daily DX: Other organ or system involvement in systemic lupus erythematosus  Last Fill: 09/22/2020  Okay to refill per Dr. Corliss Skains

## 2020-12-08 NOTE — Telephone Encounter (Signed)
Patient called stating [facility] told her Dr. Corliss Skains denied her prescription refill of Plaquenil.  Patient states her last refill was in December.  Patient states at her appointment with Ladona Ridgel on 11/01/20 she was told a prescription would be sent to the pharmacy that day.  Patient requested a return call.

## 2021-01-11 ENCOUNTER — Other Ambulatory Visit: Payer: Self-pay

## 2021-01-11 ENCOUNTER — Encounter: Payer: Self-pay | Admitting: Adult Health Nurse Practitioner

## 2021-01-11 ENCOUNTER — Ambulatory Visit (INDEPENDENT_AMBULATORY_CARE_PROVIDER_SITE_OTHER): Payer: BC Managed Care – PPO | Admitting: Adult Health Nurse Practitioner

## 2021-01-11 VITALS — BP 122/78 | HR 94 | Temp 97.5°F | Ht 64.0 in | Wt 215.6 lb

## 2021-01-11 DIAGNOSIS — E559 Vitamin D deficiency, unspecified: Secondary | ICD-10-CM | POA: Diagnosis not present

## 2021-01-11 DIAGNOSIS — M329 Systemic lupus erythematosus, unspecified: Secondary | ICD-10-CM

## 2021-01-11 DIAGNOSIS — D649 Anemia, unspecified: Secondary | ICD-10-CM

## 2021-01-11 DIAGNOSIS — Z0001 Encounter for general adult medical examination with abnormal findings: Secondary | ICD-10-CM

## 2021-01-11 DIAGNOSIS — F3341 Major depressive disorder, recurrent, in partial remission: Secondary | ICD-10-CM

## 2021-01-11 DIAGNOSIS — Z1389 Encounter for screening for other disorder: Secondary | ICD-10-CM | POA: Diagnosis not present

## 2021-01-11 DIAGNOSIS — Z1329 Encounter for screening for other suspected endocrine disorder: Secondary | ICD-10-CM

## 2021-01-11 DIAGNOSIS — Z0000 Encounter for general adult medical examination without abnormal findings: Secondary | ICD-10-CM

## 2021-01-11 DIAGNOSIS — Z131 Encounter for screening for diabetes mellitus: Secondary | ICD-10-CM

## 2021-01-11 DIAGNOSIS — Z13220 Encounter for screening for lipoid disorders: Secondary | ICD-10-CM

## 2021-01-11 DIAGNOSIS — R7309 Other abnormal glucose: Secondary | ICD-10-CM

## 2021-01-11 DIAGNOSIS — Z79899 Other long term (current) drug therapy: Secondary | ICD-10-CM

## 2021-01-11 DIAGNOSIS — Z20822 Contact with and (suspected) exposure to covid-19: Secondary | ICD-10-CM

## 2021-01-11 DIAGNOSIS — IMO0002 Reserved for concepts with insufficient information to code with codable children: Secondary | ICD-10-CM

## 2021-01-11 DIAGNOSIS — I7300 Raynaud's syndrome without gangrene: Secondary | ICD-10-CM

## 2021-01-11 NOTE — Progress Notes (Signed)
COMPLETE PHYSICAL  Assessment and Plan:  Jasmine Espinoza was seen today for annual exam.  Diagnoses and all orders for this visit:  Encounter for routine adult medical exam with abnormal findings Yearly  Lupus (HCC) Continue follow up !35month with Rheumatology   Depression, major, recurrent, in partial remission (HCC) Discussed dietary and exercise modifications  Abnormal glucose -     Hemoglobin A1c  Raynaud's disease without gangrene Managing well at this time  Anemia, unspecified type No iron supplementation  Screening cholesterol level -     Lipid panel  Screening for blood or protein in urine -     Urinalysis w microscopic + reflex cultur  Screening for thyroid disorder -     TSH  Medication management -     CBC with Differential/Platelet -     COMPLETE METABOLIC PANEL WITH GFR  Vitamin D deficiency -     VITAMIN D 25 Hydroxy (Vit-D Deficiency, Fractures)  Close exposure to COVID-19 virus -     SARS-CoV-2 Semi-Quantitative Total Antibody, Spike    Further disposition pending results if labs check today. Discussed med's effects and SE's.   Over 30 minutes of face to face interview, exam, counseling, chart review, and critical decision making was performed.   Discussed med's effects and SE's. Screening labs and tests as requested with regular follow-up as recommended.   HPI  35 y.o. female  presents for a complete physical.  She has Lupus and follows with Dr. Corliss Skains every 3 months.  She is taking Plaquenil 100mg  once daily.  She follows with Dr for her womens health.  She is on mirena which is helping.  She is married two children, girls, 10 and 7.  Personal history of COVID 05/2020.  Reports she was out of work for over 3 weeks.  She was extremley fatigued and also has some shortness of breath. She did have post covid pneumonia.  No hospitalization.  She would like antibody levels checked today as she is considering vaccination.   Her blood  pressure has been controlled at home, today their BP is BP: 122/78 She does not workout regularly but would like to start exercising more.   She denies chest pain, shortness of breath, dizziness.  She has raynauds, states has been worse with the winter/cold.  Hands and feet and would like to get on medication for me.  Patient is on Vitamin D supplement, she is taking 50,000 IU once a week.  Lab Results  Component Value Date   VD25OH 58 05/25/2020    BMI is Body mass index is 37.01 kg/m. Wt Readings from Last 3 Encounters:  01/11/21 215 lb 9.6 oz (97.8 kg)  11/01/20 220 lb 3.2 oz (99.9 kg)  08/01/20 219 lb (99.3 kg)      Due to obesity, she has preDM, denies DM poly's.  Lab Results  Component Value Date   HGBA1C 5.5 05/25/2020   She has a 34 year old and a 34 year old, both girls, competitive dance, are in school daily at private school. She started new job, working from home, was working with her mom but they got into an argument 6-8 months ago.     Current Medications:  Current Outpatient Medications on File Prior to Visit  Medication Sig Dispense Refill  . cetirizine (ZYRTEC) 5 MG tablet Take 5 mg by mouth daily.    . fluticasone (FLONASE) 50 MCG/ACT nasal spray Place 2 sprays into both nostrils daily. 16 g 0  . hydroxychloroquine (  PLAQUENIL) 200 MG tablet Take 1 tablet (200 mg total) by mouth daily. 90 tablet 0  . omeprazole (PRILOSEC) 40 MG capsule Take 1 capsule    Daily    To Prevent Heartburn & Indigestion 90 capsule 0  . Vitamin D, Ergocalciferol, (DRISDOL) 1.25 MG (50000 UNIT) CAPS capsule 1 pill 3 days a week for 8 weeks for vitamin d deficiency (Patient taking differently: One tablet once a week) 36 capsule 1  . famotidine (PEPCID) 40 MG tablet Once daily for nausea/GERD (Patient not taking: Reported on 01/11/2021) 30 tablet 1  . ondansetron (ZOFRAN) 4 MG tablet Take 1 tablet (4 mg total) by mouth daily as needed for nausea or vomiting (Not sedating). (Patient not taking:  No sig reported) 30 tablet 1   No current facility-administered medications on file prior to visit.   Health Maintenance:   Immunization History  Administered Date(s) Administered  . Influenza Split 08/01/2015  . Influenza-Unspecified 07/09/2016  . PPD Test 02/02/2015, 03/15/2015, 04/19/2016  . Tdap 04/24/2013   Tetanus: 2014 Pneumovax: N/A Flu vaccine: did not get this year Zostavax: N/A  LMP: No LMP recorded. (Menstrual status: IUD).will have spotting Pap: 2012, follows Dr. Rana Snare MGM:N/A  DEXA: N/A Colonoscopy: N/A EGD: N/A  Patient Care Team: Lucky Cowboy, MD as PCP - General (Internal Medicine) Candice Camp, MD as Consulting Physician (Obstetrics and Gynecology)  Medical History:  Past Medical History:  Diagnosis Date  . Abnormal Pap smear   . Hx of blood clots 2015   on uterus during pregnancy  . Lupus (HCC)   . Obesity (BMI 30-39.9)    BMI 38  . Postpartum depression 04/08/2014   Allergies No Known Allergies  SURGICAL HISTORY She  has a past surgical history that includes Wisdom tooth extraction. FAMILY HISTORY Her family history includes Cancer in her maternal grandmother; Heart disease in her paternal grandfather; Lupus in her paternal grandmother. SOCIAL HISTORY She  reports that she has never smoked. She has never used smokeless tobacco. She reports that she does not drink alcohol and does not use drugs.   Review of Systems  Constitutional: Negative for chills, diaphoresis, fever, malaise/fatigue and weight loss.  HENT: Negative for congestion, ear discharge, ear pain, hearing loss, nosebleeds and tinnitus.   Eyes: Negative for blurred vision, double vision, photophobia and pain.  Respiratory: Negative for cough, hemoptysis, sputum production, shortness of breath and wheezing.   Cardiovascular: Negative for chest pain, palpitations, orthopnea, claudication, leg swelling and PND.  Gastrointestinal: Positive for heartburn. Negative for abdominal pain,  blood in stool, constipation, diarrhea, melena, nausea and vomiting.  Genitourinary: Negative for dysuria, flank pain, frequency, hematuria and urgency.  Musculoskeletal: Negative for back pain, falls, joint pain, myalgias and neck pain.  Skin: Negative for itching and rash.  Neurological: Negative for dizziness, tingling, tremors, sensory change, speech change, focal weakness, weakness and headaches.  Endo/Heme/Allergies: Negative for environmental allergies and polydipsia. Does not bruise/bleed easily.  Psychiatric/Behavioral: Negative.  Negative for depression, hallucinations, substance abuse and suicidal ideas. The patient is not nervous/anxious and does not have insomnia.     Physical Exam: Estimated body mass index is 37.01 kg/m as calculated from the following:   Height as of this encounter: 5\' 4"  (1.626 m).   Weight as of this encounter: 215 lb 9.6 oz (97.8 kg). BP 122/78   Pulse 94   Temp (!) 97.5 F (36.4 C)   Ht 5\' 4"  (1.626 m)   Wt 215 lb 9.6 oz (97.8 kg)   SpO2  99%   BMI 37.01 kg/m    General Appearance: Well nourished, in no apparent distress.  Eyes: PERRLA, EOMs, conjunctiva no swelling or erythema, normal fundi and vessels.  Sinuses: No Frontal/maxillary tenderness  ENT/Mouth: Ext aud canals clear, normal light reflex with TMs without erythema, bulging. Good dentition. No erythema, swelling, or exudate on post pharynx. Tonsils not swollen or erythematous. Hearing normal.  Neck: Supple, thyroid fullness, left posterior lymph node enlargement. No bruits  Respiratory: Respiratory effort normal, BS equal bilaterally without rales, rhonchi, wheezing or stridor.  Cardio: RRR without murmurs, rubs or gallops. Brisk peripheral pulses without edema.  Chest: symmetric, with normal excursions and percussion.  Breasts:defer Abdomen: Soft, nontender, obese, no guarding, rebound, hernias, masses, or organomegaly. . Lymphatics: no enlargement, nontender Genitourinary:  defer Musculoskeletal: Full ROM all peripheral extremities,5/5 strength, and normal gait.  Skin: seb dermatitis on hair/forehead, dry flaky skin. Warm, dry without rashes, lesions, ecchymosis. Neuro: Cranial nerves intact, reflexes equal bilaterally. Normal muscle tone, no cerebellar symptoms. Sensation intact.  Psych: Awake and oriented X 3, normal affect, Insight and Judgment appropriate.   EKG: defer   Loni Dolly, DNP [facility] 01/11/2021  10:14 AM

## 2021-01-17 LAB — COMPLETE METABOLIC PANEL WITH GFR
AG Ratio: 1.2 (calc) (ref 1.0–2.5)
ALT: 10 U/L (ref 6–29)
AST: 12 U/L (ref 10–30)
Albumin: 4.1 g/dL (ref 3.6–5.1)
Alkaline phosphatase (APISO): 75 U/L (ref 31–125)
BUN: 11 mg/dL (ref 7–25)
CO2: 25 mmol/L (ref 20–32)
Calcium: 9.3 mg/dL (ref 8.6–10.2)
Chloride: 104 mmol/L (ref 98–110)
Creat: 0.66 mg/dL (ref 0.50–1.10)
GFR, Est African American: 135 mL/min/{1.73_m2} (ref >=60)
GFR, Est Non African American: 116 mL/min/{1.73_m2} (ref >=60)
Globulin: 3.4 g/dL (calc) (ref 1.9–3.7)
Glucose, Bld: 94 mg/dL (ref 65–99)
Potassium: 4.4 mmol/L (ref 3.5–5.3)
Sodium: 137 mmol/L (ref 135–146)
Total Bilirubin: 0.3 mg/dL (ref 0.2–1.2)
Total Protein: 7.5 g/dL (ref 6.1–8.1)

## 2021-01-17 LAB — URINALYSIS W MICROSCOPIC + REFLEX CULTURE
Bacteria, UA: NONE SEEN /HPF
Bilirubin Urine: NEGATIVE
Glucose, UA: NEGATIVE
Hgb urine dipstick: NEGATIVE
Hyaline Cast: NONE SEEN /LPF
Ketones, ur: NEGATIVE
Leukocyte Esterase: NEGATIVE
Nitrites, Initial: NEGATIVE
Protein, ur: NEGATIVE
RBC / HPF: NONE SEEN /HPF (ref 0–2)
Specific Gravity, Urine: 1.02 (ref 1.001–1.03)
WBC, UA: NONE SEEN /HPF (ref 0–5)
pH: 6 (ref 5.0–8.0)

## 2021-01-17 LAB — VITAMIN D 25 HYDROXY (VIT D DEFICIENCY, FRACTURES): Vit D, 25-Hydroxy: 119 ng/mL — ABNORMAL HIGH (ref 30–100)

## 2021-01-17 LAB — CBC WITH DIFFERENTIAL/PLATELET
Absolute Monocytes: 400 cells/uL (ref 200–950)
Basophils Absolute: 30 cells/uL (ref 0–200)
Basophils Relative: 0.6 %
Eosinophils Absolute: 40 cells/uL (ref 15–500)
Eosinophils Relative: 0.8 %
HCT: 43.4 % (ref 35.0–45.0)
Hemoglobin: 13.9 g/dL (ref 11.7–15.5)
Lymphs Abs: 1095 cells/uL (ref 850–3900)
MCH: 27.7 pg (ref 27.0–33.0)
MCHC: 32 g/dL (ref 32.0–36.0)
MCV: 86.6 fL (ref 80.0–100.0)
MPV: 10.8 fL (ref 7.5–12.5)
Monocytes Relative: 8 %
Neutro Abs: 3435 cells/uL (ref 1500–7800)
Neutrophils Relative %: 68.7 %
Platelets: 224 10*3/uL (ref 140–400)
RBC: 5.01 10*6/uL (ref 3.80–5.10)
RDW: 13.8 % (ref 11.0–15.0)
Total Lymphocyte: 21.9 %
WBC: 5 10*3/uL (ref 3.8–10.8)

## 2021-01-17 LAB — LIPID PANEL
Cholesterol: 125 mg/dL (ref <200)
HDL: 34 mg/dL — ABNORMAL LOW (ref >=50)
LDL Cholesterol (Calc): 76 mg/dL (calc)
Non-HDL Cholesterol (Calc): 91 mg/dL (calc) (ref <130)
Total CHOL/HDL Ratio: 3.7 (calc) (ref <5.0)
Triglycerides: 66 mg/dL (ref <150)

## 2021-01-17 LAB — TSH: TSH: 2.09 mIU/L

## 2021-01-17 LAB — HEMOGLOBIN A1C
Hgb A1c MFr Bld: 5.2 % of total Hgb (ref <5.7)
Mean Plasma Glucose: 103 mg/dL
eAG (mmol/L): 5.7 mmol/L

## 2021-01-17 LAB — SARS-COV-2 SEMI-QUANTITATIVE TOTAL ANTIBODY, SPIKE: SARS COV2 AB, Total Spike Semi QN: 788 U/mL — ABNORMAL HIGH (ref <0.8)

## 2021-01-17 LAB — NO CULTURE INDICATED

## 2021-01-18 ENCOUNTER — Other Ambulatory Visit: Payer: Self-pay | Admitting: Adult Health

## 2021-01-18 DIAGNOSIS — E559 Vitamin D deficiency, unspecified: Secondary | ICD-10-CM

## 2021-01-18 MED ORDER — VITAMIN D (ERGOCALCIFEROL) 1.25 MG (50000 UNIT) PO CAPS: ORAL_CAPSULE | ORAL | 3 refills | Status: DC

## 2021-01-18 NOTE — Progress Notes (Signed)
Office Visit Note  Patient: Jasmine Espinoza             Date of Birth: [birth year]           MRN: [id number]             PCP: Unk Pinto, MD Referring: Unk Pinto, MD Visit Date: 01/31/2021 Occupation: '@GUAROCC' @  Subjective:  Medication monitoring   History of Present Illness: Jasmine Espinoza is a 35 y.o. female with history of systemic lupus erythematosus.  She is taking plaquenil 200 mg 1 tablet by mouth daily. She is tolerating Plaquenil without any side effects.  She denies any signs or symptoms of a systemic lupus flare recently.  She denies any joint pain or joint swelling.  She continues to experience intermittent joint stiffness and has morning stiffness lasting about 1 hour daily.  She denies any recent rashes.  She has ongoing photosensitivity and has been wearing a daily moisturizer with SPF as well as a hat if she plans on being outside.  She denies any hair loss.  She has started to take biotin on a daily basis.  She denies any sores in the mouth or nose.  She has not had any sicca symptoms.  She notices intermittent swollen lymph nodes but none currently.  She denies any pleuritic chest pain, palpitations, or shortness of breath. She denies any increased fatigue.  She has been sleeping well at night.    Activities of Daily Living:  Patient reports morning stiffness for 1 hour.   Patient Denies nocturnal pain.  Difficulty dressing/grooming: Denies Difficulty climbing stairs: Reports Difficulty getting out of chair: Denies Difficulty using hands for taps, buttons, cutlery, and/or writing: Denies  Review of Systems  Constitutional: Negative for fatigue.  HENT: Negative for mouth sores, mouth dryness and nose dryness.   Eyes: Negative for pain, itching and dryness.  Respiratory: Negative for shortness of breath, wheezing and difficulty breathing.   Cardiovascular: Negative for chest pain and palpitations.  Gastrointestinal: Positive for blood in stool and  constipation. Negative for diarrhea.  Endocrine: Negative for increased urination.  Genitourinary: Negative for difficulty urinating.  Musculoskeletal: Positive for morning stiffness. Negative for arthralgias, joint pain, joint swelling, myalgias, muscle tenderness and myalgias.  Skin: Positive for color change. Negative for rash and redness.  Allergic/Immunologic: Positive for susceptible to infections.  Neurological: Positive for numbness. Negative for dizziness, headaches, memory loss and weakness.  Hematological: Positive for bruising/bleeding tendency.  Psychiatric/Behavioral: Negative for confusion.    PMFS History:  Patient Active Problem List   Diagnosis Date Noted  . Lupus ([place]) 05/25/2020  . Depression, major, recurrent, in partial remission ([place]) 05/25/2020  . Obesity 07/27/2014  . Postpartum depression 04/08/2014    Past Medical History:  Diagnosis Date  . Abnormal Pap smear   . Hx of blood clots 2015   on uterus during pregnancy  . Lupus ([place])   . Obesity (BMI 30-39.9)    BMI 38  . Postpartum depression 04/08/2014    Family History  Problem Relation Age of Onset  . Cancer Maternal Grandmother        breast, late 77s  . Heart disease Paternal Grandfather        does not know him, died when she was65  . Lupus Paternal Grandmother    Past Surgical History:  Procedure Laterality Date  . WISDOM TOOTH EXTRACTION     Social History   Social History Narrative  . Not on file   Immunization History  Administered Date(s) Administered  . Influenza Split 08/01/2015  . Influenza-Unspecified 07/09/2016  . PPD Test 02/02/2015, 03/15/2015, 04/19/2016  . Tdap 04/24/2013     Objective: Vital Signs: BP (!) 139/92 (BP Location: Left Arm, Patient Position: Sitting, Cuff Size: Normal)   Pulse 83   Resp 16   Ht '5\' 3"'  (1.6 m)   Wt 215 lb (97.5 kg)   BMI 38.09 kg/m    Physical Exam Vitals and nursing note reviewed.  Constitutional:      Appearance: She is  well-developed.  HENT:     Head: Normocephalic and atraumatic.  Eyes:     Conjunctiva/sclera: Conjunctivae normal.  Pulmonary:     Effort: Pulmonary effort is normal.  Abdominal:     Palpations: Abdomen is soft.  Musculoskeletal:     Cervical back: Normal range of motion.  Skin:    General: Skin is warm and dry.     Capillary Refill: Capillary refill takes less than 2 seconds.     Comments: No digital ulcerations or signs of gangrene.   Neurological:     Mental Status: She is alert and oriented to person, place, and time.  Psychiatric:        Behavior: Behavior normal.      Musculoskeletal Exam: C-spine, thoracic spine, lumbar spine have good range of motion with no discomfort.  Shoulder joints, elbow joints, wrist joints, MCPs, PIPs, DIPs have good range of motion with no synovitis.  She is able to make a complete fist bilaterally.  Hip joints have good range of motion with no discomfort.  No tenderness over trochanter bursa bilaterally.  Knee joints have good range of motion with no warmth or effusion.  Ankle joints have good range of motion without tenderness or joint swelling.  CDAI Exam: CDAI Score: -- Patient Global: --; Provider Global: -- Swollen: --; Tender: -- Joint Exam 01/31/2021   No joint exam has been documented for this visit   There is currently no information documented on the homunculus. Go to the Rheumatology activity and complete the homunculus joint exam.  Investigation: No additional findings.  Imaging: No results found.  Recent Labs: Lab Results  Component Value Date   WBC 5.0 01/11/2021   HGB 13.9 01/11/2021   PLT 224 01/11/2021   NA 137 01/11/2021   K 4.4 01/11/2021   CL 104 01/11/2021   CO2 25 01/11/2021   GLUCOSE 94 01/11/2021   BUN 11 01/11/2021   CREATININE 0.66 01/11/2021   BILITOT 0.3 01/11/2021   ALKPHOS 91 08/01/2016   AST 12 01/11/2021   ALT 10 01/11/2021   PROT 7.5 01/11/2021   ALBUMIN 3.9 08/01/2016   CALCIUM 9.3  01/11/2021   GFRAA 135 01/11/2021   QFTBGOLDPLUS NEGATIVE 01/11/2020    Speciality Comments: PLQ Eye Exam: 10/21/2020 WNL @ [facility] [place] Follow up in 12 months  Procedures:  No procedures performed Allergies: Patient has no known allergies.   Assessment / Plan:     Visit Diagnoses: Other organ or system involvement in systemic lupus erythematosus (HCC) - Fatigue, arthritis, malar rash, Raynaud's, photosensitivity, +ANA, +Smith, + RNP, + Ro, history of DVT during pregnancy, family history of lupus: She has not had any signs or symptoms of a systemic lupus flare recently.  She is clinically doing well taking Plaquenil 200 mg 1 tablet by mouth daily.  She continues to tolerate Plaquenil and has not missed any doses recently.  Lab work from 11/01/2020 was reviewed today in the office: CBC  and CMP WNL, double-stranded DNA negative, ESR within normal limits, and complements within normal limits.  She is not experiencing any joint pain or inflammation at this time.  She had no synovitis on examination today.  She has not had any recent rashes but has ongoing photosensitivity.  We discussed the importance of avoiding direct sun exposure, wearing sunscreen SPF greater than 50 on a daily basis, as well as sun protective clothing including a hat.  She has not had any symptoms of Raynaud's recently and has good capillary refill less than 2 seconds on examination today.  She has not had any oral or nasal ulcerations.  She has not had any sicca symptoms.  She denies any pleuritic chest pain, shortness of breath, or palpitations.  She will continue to take Plaquenil as prescribed.  She does not need a refill at this time.  She will be due to update lab work in June.  Future orders were placed today.  She was advised to notify us if she develops signs or symptoms of a flare.  She will follow up in 5 months.   High risk medication use - Plaquenil 200 mg 1 tablet by mouth daily. PLQ Eye Exam: 10/21/2020 WNL @ [facility] [place] [place] Follow up in 12 months.  CBC and CMP within normal limits on 11/01/2020.  She will be due to update lab work in June and every 5 months to monitor for drug toxicity.  Future orders placed today.  Raynaud's syndrome without gangrene: Not currently active.  She has not had any symptoms of Raynaud's recently.  No digital ulcerations or signs of gangrene were noted.  She had good capillary refill less than 2 seconds on exam.  Hair loss - Improved.  She has been taking biotin on a daily basis.   History of blood clots - DVT 13 and 2014 during pregnancy.  Anticardiolipin, beta-2 GP 1 and lupus anticoagulant were negative.  Chronic pain of both knees - X-rays of both knees were unremarkable on 01/11/20. She has good ROM of both knee joints with no discomfort.  No warmth or effusion noted.   Vitamin D deficiency - Vitamin D was 119 on 01/11/21 ordered by her PCP. She discontinued taking a vitamin D supplement.   Other medical conditions are listed as follows:   History of gastroesophageal reflux (GERD)  Seasonal allergies  Family history of systemic lupus erythematosus - Paternal grandmother  Orders: No orders of the defined types were placed in this encounter.  No orders of the defined types were placed in this encounter.     Follow-Up Instructions: Return in about 5 months (around 07/03/2021) for Systemic lupus erythematosus.   Ofilia Neas, PA-C  Note - This record has been created using Dragon software.  Chart creation errors have been sought, but may not always  have been located. Such creation errors do not reflect on  the standard of medical care.

## 2021-01-31 ENCOUNTER — Ambulatory Visit: Payer: BC Managed Care – PPO | Admitting: Physician Assistant

## 2021-01-31 ENCOUNTER — Encounter: Payer: Self-pay | Admitting: Physician Assistant

## 2021-01-31 ENCOUNTER — Other Ambulatory Visit: Payer: Self-pay

## 2021-01-31 VITALS — BP 139/92 | HR 83 | Resp 16 | Ht 63.0 in | Wt 215.0 lb

## 2021-01-31 DIAGNOSIS — Z8269 Family history of other diseases of the musculoskeletal system and connective tissue: Secondary | ICD-10-CM

## 2021-01-31 DIAGNOSIS — I7300 Raynaud's syndrome without gangrene: Secondary | ICD-10-CM | POA: Diagnosis not present

## 2021-01-31 DIAGNOSIS — M25561 Pain in right knee: Secondary | ICD-10-CM

## 2021-01-31 DIAGNOSIS — J302 Other seasonal allergic rhinitis: Secondary | ICD-10-CM

## 2021-01-31 DIAGNOSIS — E559 Vitamin D deficiency, unspecified: Secondary | ICD-10-CM

## 2021-01-31 DIAGNOSIS — L659 Nonscarring hair loss, unspecified: Secondary | ICD-10-CM

## 2021-01-31 DIAGNOSIS — M25562 Pain in left knee: Secondary | ICD-10-CM

## 2021-01-31 DIAGNOSIS — Z86718 Personal history of other venous thrombosis and embolism: Secondary | ICD-10-CM

## 2021-01-31 DIAGNOSIS — M3219 Other organ or system involvement in systemic lupus erythematosus: Secondary | ICD-10-CM | POA: Diagnosis not present

## 2021-01-31 DIAGNOSIS — Z79899 Other long term (current) drug therapy: Secondary | ICD-10-CM | POA: Diagnosis not present

## 2021-01-31 DIAGNOSIS — G8929 Other chronic pain: Secondary | ICD-10-CM

## 2021-01-31 DIAGNOSIS — Z8719 Personal history of other diseases of the digestive system: Secondary | ICD-10-CM

## 2021-01-31 NOTE — Patient Instructions (Signed)
Standing Labs We placed an order today for your standing lab work.   Please have your standing labs drawn in June  If possible, please have your labs drawn 2 weeks prior to your appointment so that the provider can discuss your results at your appointment.  We have open lab daily Monday through Thursday from 1:30-4:30 PM and Friday from 1:30-4:00 PM at the office of Dr. Shaili Deveshwar, [facility].   Please be advised, all patients with office appointments requiring lab work will take precedents over walk-in lab work.  If possible, please come for your lab work on Monday and Friday afternoons, as you may experience shorter wait times. The office is located at [address], [address], [place], [place] [address] No appointment is necessary.   Labs are drawn by [facility]. Please bring your co-pay at the time of your lab draw.  You may receive a bill from [facility] for your lab work.  If you wish to have your labs drawn at another location, please call the office 24 hours in advance to send orders.  If you have any questions regarding directions or hours of operation,  please call [phone].   As a reminder, please drink plenty of water prior to coming for your lab work. Thanks!  

## 2021-02-03 ENCOUNTER — Ambulatory Visit: Payer: BC Managed Care – PPO | Admitting: Rheumatology

## 2021-02-03 ENCOUNTER — Other Ambulatory Visit: Payer: Self-pay | Admitting: Internal Medicine

## 2021-02-03 DIAGNOSIS — R059 Cough, unspecified: Secondary | ICD-10-CM

## 2021-03-28 ENCOUNTER — Other Ambulatory Visit: Payer: Self-pay | Admitting: Rheumatology

## 2021-03-28 DIAGNOSIS — M3219 Other organ or system involvement in systemic lupus erythematosus: Secondary | ICD-10-CM

## 2021-03-28 NOTE — Telephone Encounter (Signed)
Last Visit: 01/31/2021  Next Visit: 07/04/2021  Labs: 01/11/2021 CBC/CMP WNL  Eye exam: 10/21/2020 WNL   Current Dose per office note 01/31/2021: Plaquenil 200 mg 1 tablet by mouth daily.   TK:PTWSF organ or system involvement in systemic lupus erythematosus  Last Fill: 12/08/2020  Okay to refill Plaquenil?

## 2021-05-26 DIAGNOSIS — F4320 Adjustment disorder, unspecified: Secondary | ICD-10-CM | POA: Diagnosis not present

## 2021-06-01 ENCOUNTER — Other Ambulatory Visit: Payer: Self-pay

## 2021-06-01 DIAGNOSIS — R059 Cough, unspecified: Secondary | ICD-10-CM

## 2021-06-01 MED ORDER — OMEPRAZOLE 40 MG PO CPDR: DELAYED_RELEASE_CAPSULE | ORAL | 0 refills | Status: DC

## 2021-06-20 NOTE — Progress Notes (Signed)
Office Visit Note  Patient: Jasmine Espinoza             Date of Birth: 1986-08-02           MRN: [id number]             PCP: Unk Pinto, MD Referring: Unk Pinto, MD Visit Date: 07/04/2021 Occupation: _0 @  Subjective:  Medication monitoring   History of Present Illness: Jasmine Espinoza is a 35 y.o. female with history of systemic lupus erythematosus. She is taking plaquenil 200 mg 1 tablet by mouth daily. She has not missed any doses of PLQ recently.  She denies any signs or symptoms of a systemic lupus flare.  Patient denies any increased joint pain or joint swelling.  She has been walking 2 miles on a daily basis for exercise without difficulty.  She denies any new rashes or increased photosensitivity recently.  She has not had any hair loss and has been taking biotin daily.  She experiences intermittent symptoms of Raynaud's which is typically more frequent with weather temperatures.  She denies any digital ulcerations.  She has not had any oral or nasal ulcerations.  She intermittent cervical lymphadenopathy but denies any fevers or increased fatigue during these episodes.  She denies any shortness of breath, pleuritic chest pain or palpitations.  She has not had any recent infections.      Activities of Daily Living:  Patient reports morning stiffness for 15-20 minutes.   Patient Denies nocturnal pain.  Difficulty dressing/grooming: Denies Difficulty climbing stairs: Denies Difficulty getting out of chair: Denies Difficulty using hands for taps, buttons, cutlery, and/or writing: Denies  Review of Systems  Constitutional:  Negative for fatigue.  HENT:  Negative for mouth sores, mouth dryness and nose dryness.   Eyes:  Positive for itching. Negative for pain and dryness.  Respiratory:  Negative for shortness of breath and difficulty breathing.   Cardiovascular:  Negative for chest pain and palpitations.  Gastrointestinal:  Positive for constipation.  Negative for blood in stool and diarrhea.  Endocrine: Negative for increased urination.  Genitourinary:  Negative for difficulty urinating.  Musculoskeletal:  Positive for morning stiffness. Negative for joint pain, joint pain, joint swelling, myalgias, muscle tenderness and myalgias.  Skin:  Positive for color change. Negative for rash and redness.  Allergic/Immunologic: Positive for susceptible to infections.  Neurological:  Positive for numbness. Negative for dizziness, headaches, memory loss and weakness.  Hematological:  Negative for bruising/bleeding tendency.  Psychiatric/Behavioral:  Negative for confusion.    PMFS History:  Patient Active Problem List   Diagnosis Date Noted   Lupus ([place]) 05/25/2020   Depression, major, recurrent, in partial remission ([place]) 05/25/2020   Obesity 07/27/2014   Postpartum depression 04/08/2014    Past Medical History:  Diagnosis Date   Abnormal Pap smear    Hx of blood clots 2015   on uterus during pregnancy   Lupus ([place])    Obesity (BMI 30-39.9)    BMI 38   Postpartum depression 04/08/2014    Family History  Problem Relation Age of Onset   Cancer Maternal Grandmother        breast, late 36s   Heart disease Paternal Grandfather        does not know him, died when she was76   Lupus Paternal Grandmother    Past Surgical History:  Procedure Laterality Date   WISDOM TOOTH EXTRACTION     Social History   Social History Narrative   Not on file  Immunization History  Administered Date(s) Administered   Influenza Split 08/01/2015   Influenza-Unspecified 07/09/2016   PPD Test 02/02/2015, 03/15/2015, 04/19/2016   Tdap 04/24/2013     Objective: Vital Signs: BP 131/86 (BP Location: Left Arm, Patient Position: Sitting, Cuff Size: Normal)   Pulse 79   Ht 5' 3" (1.6 m)   Wt 218 lb 3.2 oz (99 kg)   BMI 38.65 kg/m    Physical Exam Vitals and nursing note reviewed.  Constitutional:      Appearance: She is well-developed.  HENT:      Head: Normocephalic and atraumatic.  Eyes:     Conjunctiva/sclera: Conjunctivae normal.  Cardiovascular:     Rate and Rhythm: Normal rate and regular rhythm.     Heart sounds: Normal heart sounds.  Pulmonary:     Effort: Pulmonary effort is normal.     Breath sounds: Normal breath sounds.  Abdominal:     General: Bowel sounds are normal.     Palpations: Abdomen is soft.  Musculoskeletal:     Cervical back: Normal range of motion.  Lymphadenopathy:     Cervical: Cervical adenopathy (1 small LN palpable along the posterior cervical chain) present.  Skin:    General: Skin is warm and dry.     Capillary Refill: Capillary refill takes less than 2 seconds.     Comments: Diffuse facial erythema.   No digital ulcerations or signs of sclerodactyly.   Neurological:     Mental Status: She is alert and oriented to person, place, and time.  Psychiatric:        Behavior: Behavior normal.     Musculoskeletal Exam: C-spine has good ROM with no discomfort.  Shoulder joints, elbow joints, wrist joints, MCPs, PIPs, and DIPs good ROM with no synovitis.  Complete fist formation bilaterally.  Complete fist formation bilaterally.  Hip joints, knee joints, and ankle joints have good ROM with no discomfort.  No warmth or effusion of knee joints.  No tenderness or swelling of ankle joints.  No tenderness over MTP joints.   CDAI Exam: CDAI Score: -- Patient Global: --; Provider Global: -- Swollen: --; Tender: -- Joint Exam 07/04/2021   No joint exam has been documented for this visit   There is currently no information documented on the homunculus. Go to the Rheumatology activity and complete the homunculus joint exam.  Investigation: No additional findings.  Imaging: No results found.  Recent Labs: Lab Results  Component Value Date   WBC 5.0 01/11/2021   HGB 13.9 01/11/2021   PLT 224 01/11/2021   NA 137 01/11/2021   K 4.4 01/11/2021   CL 104 01/11/2021   CO2 25 01/11/2021   GLUCOSE 94  01/11/2021   BUN 11 01/11/2021   CREATININE 0.66 01/11/2021   BILITOT 0.3 01/11/2021   ALKPHOS 91 08/01/2016   AST 12 01/11/2021   ALT 10 01/11/2021   PROT 7.5 01/11/2021   ALBUMIN 3.9 08/01/2016   CALCIUM 9.3 01/11/2021   GFRAA 135 01/11/2021   QFTBGOLDPLUS NEGATIVE 01/11/2020    Speciality Comments: PLQ Eye Exam: 10/21/2020 WNL @ [facility] Follow up in 12 months  Procedures:  No procedures performed Allergies: Patient has no known allergies.     Assessment / Plan:     Visit Diagnoses: Other organ or system involvement in systemic lupus erythematosus (HCC) - Fatigue, arthritis, malar rash, Raynaud's, photosensitivity, +ANA, +Smith, + RNP, + Ro, history of DVT during pregnancy, family history of lupus: She has not  had any signs or symptoms of a systemic lupus flare recently.  She has clinically been doing well taking Plaquenil 200 mg 1 tablet by mouth daily.  She continues to tolerate Plaquenil without any side effects and has not missed any doses recently.  She is not experiencing any joint pain, stiffness, or swelling at this time.  No synovitis was noted on examination today.  She has been walking 2 miles on a daily basis for exercise without difficulty.  She has been trying to walk in the evenings to avoid direct sun exposure.  We discussed the importance of wearing sunscreen SPF greater than 50 on a daily basis and to reapply every 2 hours if she plans on being outside.  She has not developed any new rashes.  She experiences intermittent symptoms of Raynaud's but no digital ulcerations or signs of sclerodactyly were noted.  Discussed the importance of avoiding triggers.  She has not had any oral or nasal ulcerations.  No sicca symptoms.  She has one small, nontender LN, left posterior cervical chain noted.  She has not had any recent infections and has not noticed any low-grade fevers or increased fatigue.  According to the patient she has a longstanding history of cervical  lymphadenopathy and has had ultrasounds in the past which were unremarkable.  She has not been experiencing any shortness of breath, pleuritic chest pain, or palpitations.  Her lungs were clear to auscultation on examination today. Lab work from 11/01/20 was reviewed today in the office: dsDNA is negative, complements WNL, and ESR WNL.  CBC and CMP within normal limits on 01/31/2021.  She is due to update autoimmune lab work today.  She will remain on Plaquenil as prescribed.  She does not need a refill at this time.  She was advised to notify us if she develops signs or symptoms of a flare.  She will follow-up in the office in 5 months. - Plan: Protein / creatinine ratio, urine, Anti-DNA antibody, double-stranded, C3 and C4, Sedimentation rate, ANA, Anti-Smith antibody, RNP Antibody, Sjogrens syndrome-A extractable nuclear antibody  High risk medication use - Plaquenil 200 mg 1 tablet by mouth daily.  CBC and CMP within normal limits on 01/11/2021.  PLQ Eye Exam: 10/21/2020 WNL @ [facility] Follow up in 12 months.   Plan: CBC with Differential/Platelet, COMPLETE METABOLIC PANEL WITH GFR  Raynaud's syndrome without gangrene: She experiences intermittent symptoms of Raynaud's especially with colder weather temperatures.  No digital ulcerations or signs of sclerodactyly were noted.  Discussed the importance of avoiding exposure to extremely cold temperatures.  We also discussed the importance of keeping her core body temperature warm and wearing gloves and thick socks throughout the fall and winter.  She was advised to notify us if she develops any new or worsening symptoms.  Hair loss: Improved.  She has been taking biotin on a daily basis.   History of blood clots - DVT 2013 and 2014 during pregnancy.  Anticardiolipin, beta-2 GP 1 and lupus anticoagulant were negative.  Chronic pain of both knees - X-rays of both knees were unremarkable on 01/11/20.  She is good range of motion of both knee joints  on examination today with no discomfort.  No warmth or effusion was noted.  She has been walking 2 miles on a daily basis for exercise without difficulty.  Other medical conditions are listed as follows:  Vitamin D deficiency  Seasonal allergies  History of gastroesophageal reflux (GERD)  Family history of systemic  lupus erythematosus  Orders: Orders Placed This Encounter  Procedures   Protein / creatinine ratio, urine   CBC with Differential/Platelet   COMPLETE METABOLIC PANEL WITH GFR   Anti-DNA antibody, double-stranded   C3 and C4   Sedimentation rate   ANA   Anti-Smith antibody   RNP Antibody   Sjogrens syndrome-A extractable nuclear antibody   No orders of the defined types were placed in this encounter.    Follow-Up Instructions: Return in about 5 months (around 12/04/2021) for Systemic lupus erythematosus.   Ofilia Neas, PA-C  Note - This record has been created using Dragon software.  Chart creation errors have been sought, but may not always  have been located. Such creation errors do not reflect on  the standard of medical care.

## 2021-06-28 DIAGNOSIS — F4320 Adjustment disorder, unspecified: Secondary | ICD-10-CM | POA: Diagnosis not present

## 2021-07-04 ENCOUNTER — Ambulatory Visit: Payer: BC Managed Care – PPO | Admitting: Physician Assistant

## 2021-07-04 ENCOUNTER — Other Ambulatory Visit: Payer: Self-pay

## 2021-07-04 ENCOUNTER — Encounter: Payer: Self-pay | Admitting: Physician Assistant

## 2021-07-04 VITALS — BP 131/86 | HR 79 | Ht 63.0 in | Wt 218.2 lb

## 2021-07-04 DIAGNOSIS — Z8719 Personal history of other diseases of the digestive system: Secondary | ICD-10-CM

## 2021-07-04 DIAGNOSIS — L659 Nonscarring hair loss, unspecified: Secondary | ICD-10-CM

## 2021-07-04 DIAGNOSIS — M25561 Pain in right knee: Secondary | ICD-10-CM

## 2021-07-04 DIAGNOSIS — Z79899 Other long term (current) drug therapy: Secondary | ICD-10-CM

## 2021-07-04 DIAGNOSIS — Z8269 Family history of other diseases of the musculoskeletal system and connective tissue: Secondary | ICD-10-CM

## 2021-07-04 DIAGNOSIS — G8929 Other chronic pain: Secondary | ICD-10-CM

## 2021-07-04 DIAGNOSIS — I7300 Raynaud's syndrome without gangrene: Secondary | ICD-10-CM

## 2021-07-04 DIAGNOSIS — E559 Vitamin D deficiency, unspecified: Secondary | ICD-10-CM

## 2021-07-04 DIAGNOSIS — J302 Other seasonal allergic rhinitis: Secondary | ICD-10-CM

## 2021-07-04 DIAGNOSIS — Z86718 Personal history of other venous thrombosis and embolism: Secondary | ICD-10-CM

## 2021-07-04 DIAGNOSIS — M3219 Other organ or system involvement in systemic lupus erythematosus: Secondary | ICD-10-CM | POA: Diagnosis not present

## 2021-07-04 DIAGNOSIS — M25562 Pain in left knee: Secondary | ICD-10-CM

## 2021-07-05 NOTE — Progress Notes (Signed)
CBC and CMP WNL. Protein creatinine ratio is WNL. dsDNA is negative.  Complements and ESR WNL.   Smith, RNP and Ro antibody remain positive.  Labs are not consistent with an acute flare.

## 2021-07-06 LAB — SJOGRENS SYNDROME-A EXTRACTABLE NUCLEAR ANTIBODY: SSA (Ro) (ENA) Antibody, IgG: 7.1 AI — AB

## 2021-07-06 LAB — CBC WITH DIFFERENTIAL/PLATELET
Absolute Monocytes: 528 cells/uL (ref 200–950)
Basophils Absolute: 29 cells/uL (ref 0–200)
Basophils Relative: 0.6 %
Eosinophils Absolute: 67 cells/uL (ref 15–500)
Eosinophils Relative: 1.4 %
HCT: 42.4 % (ref 35.0–45.0)
Hemoglobin: 13.7 g/dL (ref 11.7–15.5)
Lymphs Abs: 1152 cells/uL (ref 850–3900)
MCH: 28.1 pg (ref 27.0–33.0)
MCHC: 32.3 g/dL (ref 32.0–36.0)
MCV: 86.9 fL (ref 80.0–100.0)
MPV: 10.8 fL (ref 7.5–12.5)
Monocytes Relative: 11 %
Neutro Abs: 3024 cells/uL (ref 1500–7800)
Neutrophils Relative %: 63 %
Platelets: 250 10*3/uL (ref 140–400)
RBC: 4.88 10*6/uL (ref 3.80–5.10)
RDW: 13.2 % (ref 11.0–15.0)
Total Lymphocyte: 24 %
WBC: 4.8 10*3/uL (ref 3.8–10.8)

## 2021-07-06 LAB — ANTI-DNA ANTIBODY, DOUBLE-STRANDED: ds DNA Ab: 1 IU/mL

## 2021-07-06 LAB — COMPLETE METABOLIC PANEL WITH GFR
AG Ratio: 1.3 (calc) (ref 1.0–2.5)
ALT: 14 U/L (ref 6–29)
AST: 13 U/L (ref 10–30)
Albumin: 4.2 g/dL (ref 3.6–5.1)
Alkaline phosphatase (APISO): 75 U/L (ref 31–125)
BUN: 8 mg/dL (ref 7–25)
CO2: 26 mmol/L (ref 20–32)
Calcium: 9.5 mg/dL (ref 8.6–10.2)
Chloride: 107 mmol/L (ref 98–110)
Creat: 0.72 mg/dL (ref 0.50–0.97)
Globulin: 3.3 g/dL (calc) (ref 1.9–3.7)
Glucose, Bld: 84 mg/dL (ref 65–99)
Potassium: 4.8 mmol/L (ref 3.5–5.3)
Sodium: 140 mmol/L (ref 135–146)
Total Bilirubin: 0.4 mg/dL (ref 0.2–1.2)
Total Protein: 7.5 g/dL (ref 6.1–8.1)
eGFR: 112 mL/min/{1.73_m2} (ref >=60)

## 2021-07-06 LAB — ANTI-SMITH ANTIBODY: ENA SM Ab Ser-aCnc: 1.1 AI — AB

## 2021-07-06 LAB — ANTI-NUCLEAR AB-TITER (ANA TITER): ANA Titer 1: 1:1280 {titer} — ABNORMAL HIGH

## 2021-07-06 LAB — ANA: Anti Nuclear Antibody (ANA): POSITIVE — AB

## 2021-07-06 LAB — RNP ANTIBODY: Ribonucleic Protein(ENA) Antibody, IgG: >8 AI — AB

## 2021-07-06 LAB — PROTEIN / CREATININE RATIO, URINE
Creatinine, Urine: 77 mg/dL (ref 20–275)
Protein/Creat Ratio: 91 mg/g creat (ref 24–184)
Protein/Creatinine Ratio: 0.091 mg/mg creat (ref 0.024–0.184)
Total Protein, Urine: 7 mg/dL (ref 5–24)

## 2021-07-06 LAB — C3 AND C4
C3 Complement: 112 mg/dL (ref 83–193)
C4 Complement: 28 mg/dL (ref 15–57)

## 2021-07-06 LAB — SEDIMENTATION RATE: Sed Rate: 6 mm/h (ref 0–20)

## 2021-07-07 NOTE — Progress Notes (Signed)
ANA remains positive, titer is unchanged.

## 2021-07-27 DIAGNOSIS — F4320 Adjustment disorder, unspecified: Secondary | ICD-10-CM | POA: Diagnosis not present

## 2021-08-10 ENCOUNTER — Other Ambulatory Visit: Payer: Self-pay | Admitting: Physician Assistant

## 2021-08-10 DIAGNOSIS — M3219 Other organ or system involvement in systemic lupus erythematosus: Secondary | ICD-10-CM

## 2021-08-10 NOTE — Telephone Encounter (Signed)
Next Visit: 12/05/2021  Last Visit: 07/04/2021   Labs: 07/04/2021 CBC and CMP WNL.  Eye exam: 10/21/2020 WNL   Current Dose per office note 07/04/2021: Plaquenil 200 mg 1 tablet by mouth daily  IR:WERXV organ or system involvement in systemic lupus erythematosus  Last Fill: 03/28/2021   Okay to refill Plaquenil?

## 2021-08-24 DIAGNOSIS — F4320 Adjustment disorder, unspecified: Secondary | ICD-10-CM | POA: Diagnosis not present

## 2021-09-15 DIAGNOSIS — F4320 Adjustment disorder, unspecified: Secondary | ICD-10-CM | POA: Diagnosis not present

## 2021-10-11 ENCOUNTER — Other Ambulatory Visit: Payer: Self-pay | Admitting: Nurse Practitioner

## 2021-10-11 DIAGNOSIS — R059 Cough, unspecified: Secondary | ICD-10-CM

## 2021-10-12 MED ORDER — OMEPRAZOLE 40 MG PO CPDR: DELAYED_RELEASE_CAPSULE | ORAL | 0 refills | Status: DC

## 2021-11-21 NOTE — Progress Notes (Signed)
Office Visit Note  Patient: Jasmine Espinoza             Date of Birth: 1986-04-29           MRN: [id number]             PCP: Unk Pinto, MD Referring: Unk Pinto, MD Visit Date: 12/05/2021 Occupation: '@GUAROCC' @  Subjective:  Medication monitoring  History of Present Illness: Jasmine Espinoza is a 36 y.o. female with history of systemic lupus erythematosus.  Patient is taking Plaquenil 200 mg 1 tablet by mouth daily.  She is tolerating Plaquenil without any side effects.  She denies any signs or symptoms of a systemic lupus flare.  She has been experiencing intermittent discomfort in both knees especially when bending her knees.  She denies any warmth or swelling.  She has not needed to take any over-the-counter products for symptomatic relief since her pain has been so intermittent.  She has increased her exercise regimen and has been walking 2 to 3 days a week which she feels may be contributing to some of her increased discomfort.  She denies any other joint pain or joint swelling at this time.  She has had some increased frequency of Raynaud's phenomenon in her fingertips with colder weather temperatures.  She denies any recent rashes or hair loss.  She denies any oral or nasal ulcerations.  She has not had any sicca symptoms.  She denies any shortness of breath, pleuritic chest pain, or palpitations.  She has not had any fevers.  She has occasional swollen lymph nodes which she attributes to allergies. She denies any new medical conditions.     Activities of Daily Living:  Patient reports morning stiffness for 30 minutes.   Patient Reports nocturnal pain.  Difficulty dressing/grooming: Denies Difficulty climbing stairs: Denies Difficulty getting out of chair: Denies Difficulty using hands for taps, buttons, cutlery, and/or writing: Denies  Review of Systems  Constitutional:  Negative for fatigue.  HENT:  Negative for mouth sores, mouth dryness and nose dryness.    Eyes:  Negative for pain, itching and dryness.  Respiratory:  Negative for shortness of breath and difficulty breathing.   Cardiovascular:  Negative for chest pain and palpitations.  Gastrointestinal:  Positive for blood in stool and constipation. Negative for diarrhea.  Endocrine: Negative for increased urination.  Genitourinary:  Negative for difficulty urinating.  Musculoskeletal:  Positive for joint pain, joint pain, myalgias, morning stiffness and myalgias. Negative for joint swelling and muscle tenderness.  Skin:  Positive for color change, rash and redness.  Allergic/Immunologic: Positive for susceptible to infections.  Neurological:  Positive for numbness and headaches. Negative for dizziness, memory loss and weakness.  Hematological:  Positive for bruising/bleeding tendency.  Psychiatric/Behavioral:  Negative for confusion.    PMFS History:  Patient Active Problem List   Diagnosis Date Noted   Lupus ([place]) 05/25/2020   Depression, major, recurrent, in partial remission ([place]) 05/25/2020   Obesity 07/27/2014   Postpartum depression 04/08/2014    Past Medical History:  Diagnosis Date   Abnormal Pap smear    Hx of blood clots 2015   on uterus during pregnancy   Lupus ([place])    Obesity (BMI 30-39.9)    BMI 38   Postpartum depression 04/08/2014    Family History  Problem Relation Age of Onset   Cancer Maternal Grandmother        breast, late 60s   Heart disease Paternal Grandfather  does not know him, died when she was75   Lupus Paternal Grandmother    Past Surgical History:  Procedure Laterality Date   WISDOM TOOTH EXTRACTION     Social History   Social History Narrative   Not on file   Immunization History  Administered Date(s) Administered   Influenza Split 08/01/2015   Influenza-Unspecified 07/09/2016   PPD Test 02/02/2015, 03/15/2015, 04/19/2016   Tdap 04/24/2013     Objective: Vital Signs: BP 127/89 (BP Location: Left Arm, Patient Position:  Sitting, Cuff Size: Large)    Pulse 86    Ht '5\' 3"'  (1.6 m)    Wt 210 lb 3.2 oz (95.3 kg)    BMI 37.24 kg/m    Physical Exam Vitals and nursing note reviewed.  Constitutional:      Appearance: She is well-developed.  HENT:     Head: Normocephalic and atraumatic.  Eyes:     Conjunctiva/sclera: Conjunctivae normal.  Cardiovascular:     Rate and Rhythm: Normal rate and regular rhythm.     Heart sounds: Normal heart sounds.  Pulmonary:     Effort: Pulmonary effort is normal.     Breath sounds: Normal breath sounds.  Abdominal:     General: Bowel sounds are normal.     Palpations: Abdomen is soft.  Musculoskeletal:     Cervical back: Normal range of motion.  Skin:    General: Skin is warm and dry.     Capillary Refill: Capillary refill takes less than 2 seconds.  Neurological:     Mental Status: She is alert and oriented to person, place, and time.  Psychiatric:        Behavior: Behavior normal.     Musculoskeletal Exam: C-spine, thoracic spine, and lumbar spine have good range of motion.  Shoulder joints, elbow joints, wrist joints, MCPs, PIPs, DIPs have good range of motion with no synovitis.  She was able to make a complete fist bilaterally.  Hip joints have good range of motion with no groin pain.  No tenderness over trochanteric bursa bilaterally.  Knee joints have good range of motion with no warmth or effusion.  Ankle joints have good range of motion with no tenderness or joint swelling.  CDAI Exam: CDAI Score: -- Patient Global: --; Provider Global: -- Swollen: --; Tender: -- Joint Exam 12/05/2021   No joint exam has been documented for this visit   There is currently no information documented on the homunculus. Go to the Rheumatology activity and complete the homunculus joint exam.  Investigation: No additional findings.  Imaging: No results found.  Recent Labs: Lab Results  Component Value Date   WBC 4.8 07/04/2021   HGB 13.7 07/04/2021   PLT 250 07/04/2021    NA 140 07/04/2021   K 4.8 07/04/2021   CL 107 07/04/2021   CO2 26 07/04/2021   GLUCOSE 84 07/04/2021   BUN 8 07/04/2021   CREATININE 0.72 07/04/2021   BILITOT 0.4 07/04/2021   ALKPHOS 91 08/01/2016   AST 13 07/04/2021   ALT 14 07/04/2021   PROT 7.5 07/04/2021   ALBUMIN 3.9 08/01/2016   CALCIUM 9.5 07/04/2021   GFRAA 135 01/11/2021   QFTBGOLDPLUS NEGATIVE 01/11/2020    Speciality Comments: PLQ Eye Exam: 10/21/2020 WNL @ [facility] [place] Follow up in 12 months  Procedures:  No procedures performed Allergies: Patient has no known allergies.     Assessment / Plan:     Visit Diagnoses: Other organ or system involvement in systemic  lupus erythematosus (HCC) - Fatigue, arthritis, malar rash, Raynaud's, photosensitivity, +ANA, +Smith, + RNP, + Ro, history of DVT during pregnancy, family history of lupus: She has not had any signs or symptoms of a systemic lupus flare.  She is clinically doing well taking Plaquenil 200 mg 1 tablet by mouth daily.  She continues to tolerate Plaquenil without any side effects and has not missed any doses recently.  She has been experiencing some increased discomfort in both knee joints but has no warmth or effusion on examination today.  Discussed the importance of lower extremity muscle strengthening as well as the use of Voltaren gel.  She is not experiencing other joint pain or inflammation at this time.  She has not had any recent rashes, increased photosensitivity, hair loss, oral or nasal ulcerations, sicca symptoms, shortness of breath, or pleuritic chest pain.  She experiences intermittent symptoms of Raynaud's but has no digital ulcerations or signs of gangrene. Lab work from 07/04/2021 was reviewed today in the office: ANA, RNP, Smith antibody, and Ro antibody remain positive.  Double-stranded DNA negative, complements within normal limits, and ESR within normal limits.  She is due to update the following lab work today.  Orders were released. She  will remain on Plaquenil as prescribed.  A refill of Plaquenil was sent to the pharmacy today.  She was advised to notify us if she develops signs or symptoms of a flare.  She will follow-up in the office in 5 months.  - Plan: CBC with Differential/Platelet, COMPLETE METABOLIC PANEL WITH GFR, Urinalysis, Routine w reflex microscopic, Anti-DNA antibody, double-stranded, C3 and C4, Sedimentation rate, hydroxychloroquine (PLAQUENIL) 200 MG tablet  High risk medication use - Plaquenil 200 mg 1 tablet by mouth daily.  PLQ Eye Exam: 10/21/2020 WNL @ [facility] [place] Follow up in 12 months.  Patient was encouraged to schedule a Plaquenil eye examination.  She was given a Plaquenil eye exam form to take with her to her upcoming appointment. CBC and CMP within normal limits on 07/04/2021.  CBC and CMP will be updated today. - Plan: CBC with Differential/Platelet, COMPLETE METABOLIC PANEL WITH GFR  Raynaud's syndrome without gangrene: She has been experiencing intermittent symptoms of Raynaud's which has been more frequent with colder weather temperatures.  Discussed the importance of keeping her core body temperature warm and wearing gloves and socks.  No digital ulcerations or signs of gangrene were noted.  No signs of sclerodactyly were noted.  Hair loss: She has not had any recent hair loss.   History of blood clots - DVT 2013 and 2014 during pregnancy.  Anticardiolipin, beta-2 GP 1 and lupus anticoagulant were negative.  Chronic pain of both knees - X-rays of both knees were unremarkable on 01/11/20.  She continues to experience intermittent discomfort in both knee joints.  She has not had any recent injuries or falls.  No mechanical symptoms.  She has not had any warmth or swelling in her knees.  She has been walking 2 to 3 days/week outside which she feels may be contributing to some of her increased discomfort.  Her symptoms have been mild and intermittent so she has not taken any over-the-counter  products for symptomatic relief. On examination she has good range of motion in both knee joints with some discomfort with flexion of the right knee.  No warmth or effusion was noted. She declined updated x-rays of the knees at this time.  Different treatment options were discussed today.  She was encouraged to  use Voltaren gel topically as needed for symptomatic relief.  Discussed the importance of lower extremity muscle strengthening.  She was advised to notify us if her symptoms persist or worsen.  Other medical conditions are listed as follows:  Seasonal allergies  Vitamin D deficiency  History of gastroesophageal reflux (GERD)  Family history of systemic lupus erythematosus   Orders: Orders Placed This Encounter  Procedures   CBC with Differential/Platelet   COMPLETE METABOLIC PANEL WITH GFR   Urinalysis, Routine w reflex microscopic   Anti-DNA antibody, double-stranded   C3 and C4   Sedimentation rate   Meds ordered this encounter  Medications   hydroxychloroquine (PLAQUENIL) 200 MG tablet    Sig: TAKE ONE (1) TABLET BY MOUTH EVERY DAY    Dispense:  90 tablet    Refill:  0      Follow-Up Instructions: Return in about 5 months (around 05/04/2022) for Systemic lupus erythematosus.   Ofilia Neas, PA-C  Note - This record has been created using Dragon software.  Chart creation errors have been sought, but may not always  have been located. Such creation errors do not reflect on  the standard of medical care.

## 2021-11-27 ENCOUNTER — Encounter: Payer: BC Managed Care – PPO | Admitting: Adult Health Nurse Practitioner

## 2021-12-05 ENCOUNTER — Encounter: Payer: Self-pay | Admitting: Physician Assistant

## 2021-12-05 ENCOUNTER — Ambulatory Visit (INDEPENDENT_AMBULATORY_CARE_PROVIDER_SITE_OTHER): Payer: BC Managed Care – PPO | Admitting: Physician Assistant

## 2021-12-05 ENCOUNTER — Other Ambulatory Visit: Payer: Self-pay

## 2021-12-05 VITALS — BP 127/89 | HR 86 | Ht 63.0 in | Wt 210.2 lb

## 2021-12-05 DIAGNOSIS — Z79899 Other long term (current) drug therapy: Secondary | ICD-10-CM

## 2021-12-05 DIAGNOSIS — M3219 Other organ or system involvement in systemic lupus erythematosus: Secondary | ICD-10-CM

## 2021-12-05 DIAGNOSIS — L659 Nonscarring hair loss, unspecified: Secondary | ICD-10-CM | POA: Diagnosis not present

## 2021-12-05 DIAGNOSIS — J302 Other seasonal allergic rhinitis: Secondary | ICD-10-CM

## 2021-12-05 DIAGNOSIS — I7300 Raynaud's syndrome without gangrene: Secondary | ICD-10-CM

## 2021-12-05 DIAGNOSIS — M25562 Pain in left knee: Secondary | ICD-10-CM

## 2021-12-05 DIAGNOSIS — E559 Vitamin D deficiency, unspecified: Secondary | ICD-10-CM

## 2021-12-05 DIAGNOSIS — Z8719 Personal history of other diseases of the digestive system: Secondary | ICD-10-CM

## 2021-12-05 DIAGNOSIS — G8929 Other chronic pain: Secondary | ICD-10-CM

## 2021-12-05 DIAGNOSIS — Z8269 Family history of other diseases of the musculoskeletal system and connective tissue: Secondary | ICD-10-CM

## 2021-12-05 DIAGNOSIS — Z86718 Personal history of other venous thrombosis and embolism: Secondary | ICD-10-CM

## 2021-12-05 DIAGNOSIS — M25561 Pain in right knee: Secondary | ICD-10-CM

## 2021-12-05 MED ORDER — HYDROXYCHLOROQUINE SULFATE 200 MG PO TABS: ORAL_TABLET | ORAL | 0 refills | Status: DC

## 2021-12-05 NOTE — Patient Instructions (Signed)
Knee Exercises Ask your health care provider which exercises are safe for you. Do exercises exactly as told by your health care provider and adjust them as directed. It is normal to feel mild stretching, pulling, tightness, or discomfort as you do these exercises. Stop right away if you feel sudden pain or your pain gets worse. Do not begin these exercises until told by your health care provider. Stretching and range-of-motion exercises These exercises warm up your muscles and joints and improve the movement and flexibility of your knee. These exercises also help to relieve pain and swelling. Knee extension, prone  Lie on your abdomen (prone position) on a bed. Place your left / right knee just beyond the edge of the surface so your knee is not on the bed. You can put a towel under your left / right thigh just above your kneecap for comfort. Relax your leg muscles and allow gravity to straighten your knee (extension). You should feel a stretch behind your left / right knee. Hold this position for __________ seconds. Scoot up so your knee is supported between repetitions. Repeat __________ times. Complete this exercise __________ times a day. Knee flexion, active  Lie on your back with both legs straight. If this causes back discomfort, bend your left / right knee so your foot is flat on the floor. Slowly slide your left / right heel back toward your buttocks. Stop when you feel a gentle stretch in the front of your knee or thigh (flexion). Hold this position for __________ seconds. Slowly slide your left / right heel back to the starting position. Repeat __________ times. Complete this exercise __________ times a day. Quadriceps stretch, prone  Lie on your abdomen on a firm surface, such as a bed or padded floor. Bend your left / right knee and hold your ankle. If you cannot reach your ankle or pant leg, loop a belt around your foot and grab the belt instead. Gently pull your heel toward your  buttocks. Your knee should not slide out to the side. You should feel a stretch in the front of your thigh and knee (quadriceps). Hold this position for __________ seconds. Repeat __________ times. Complete this exercise __________ times a day. Hamstring, supine  Lie on your back (supine position). Loop a belt or towel over the ball of your left / right foot. The ball of your foot is on the walking surface, right under your toes. Straighten your left / right knee and slowly pull on the belt to raise your leg until you feel a gentle stretch behind your knee (hamstring). Do not let your knee bend while you do this. Keep your other leg flat on the floor. Hold this position for __________ seconds. Repeat __________ times. Complete this exercise __________ times a day. Strengthening exercises These exercises build strength and endurance in your knee. Endurance is the ability to use your muscles for a long time, even after they get tired. Quadriceps, isometric This exercise strengthens the muscles in front of your thigh (quadriceps) without moving your knee joint (isometric). Lie on your back with your left / right leg extended and your other knee bent. Put a rolled towel or small pillow under your knee if told by your health care provider. Slowly tense the muscles in the front of your left / right thigh. You should see your kneecap slide up toward your hip or see increased dimpling just above the knee. This motion will push the back of the knee toward the floor.   For __________ seconds, hold the muscle as tight as you can without increasing your pain. Relax the muscles slowly and completely. Repeat __________ times. Complete this exercise __________ times a day. Straight leg raises This exercise strengthens the muscles in front of your thigh (quadriceps) and the muscles that move your hips (hip flexors). Lie on your back with your left / right leg extended and your other knee bent. Tense the  muscles in the front of your left / right thigh. You should see your kneecap slide up or see increased dimpling just above the knee. Your thigh may even shake a bit. Keep these muscles tight as you raise your leg 4-6 inches (10-15 cm) off the floor. Do not let your knee bend. Hold this position for __________ seconds. Keep these muscles tense as you lower your leg. Relax your muscles slowly and completely after each repetition. Repeat __________ times. Complete this exercise __________ times a day. Hamstring, isometric  Lie on your back on a firm surface. Bend your left / right knee about __________ degrees. Dig your left / right heel into the surface as if you are trying to pull it toward your buttocks. Tighten the muscles in the back of your thighs (hamstring) to "dig" as hard as you can without increasing any pain. Hold this position for __________ seconds. Release the tension gradually and allow your muscles to relax completely for __________ seconds after each repetition. Repeat __________ times. Complete this exercise __________ times a day. Hamstring curls If told by your health care provider, do this exercise while wearing ankle weights. Begin with __________lb / kg weights. Then increase the weight by 1 lb (0.5 kg) increments. Do not wear ankle weights that are more than __________lb / kg. Lie on your abdomen with your legs straight. Bend your left / right knee as far as you can without feeling pain. Keep your hips flat against the floor. Hold this position for __________ seconds. Slowly lower your leg to the starting position. Repeat __________ times. Complete this exercise __________ times a day. Squats This exercise strengthens the muscles in front of your thigh and knee (quadriceps). Stand in front of a table, with your feet and knees pointing straight ahead. You may rest your hands on the table for balance but not for support. Slowly bend your knees and lower your hips like you  are going to sit in a chair. Keep your weight over your heels, not over your toes. Keep your lower legs upright so they are parallel with the table legs. Do not let your hips go lower than your knees. Do not bend lower than told by your health care provider. If your knee pain increases, do not bend as low. Hold the squat position for __________ seconds. Slowly push with your legs to return to standing. Do not use your hands to pull yourself to standing. Repeat __________ times. Complete this exercise __________ times a day. Wall slides This exercise strengthens the muscles in front of your thigh and knee (quadriceps). Lean your back against a smooth wall or door, and walk your feet out 18-24 inches (46-61 cm) from it. Place your feet hip-width apart. Slowly slide down the wall or door until your knees bend __________ degrees. Keep your knees over your heels, not over your toes. Keep your knees in line with your hips. Hold this position for __________ seconds. Repeat __________ times. Complete this exercise __________ times a day. Straight leg raises, side-lying This exercise strengthens the muscles that rotate   the leg at the hip and move it away from your body (hip abductors). Lie on your side with your left / right leg in the top position. Lie so your head, shoulder, knee, and hip line up. You may bend your bottom knee to help you keep your balance. Roll your hips slightly forward so your hips are stacked directly over each other and your left / right knee is facing forward. Leading with your heel, lift your top leg 4-6 inches (10-15 cm). You should feel the muscles in your outer hip lifting. Do not let your foot drift forward. Do not let your knee roll toward the ceiling. Hold this position for __________ seconds. Slowly return your leg to the starting position. Let your muscles relax completely after each repetition. Repeat __________ times. Complete this exercise __________ times a  day. Straight leg raises, prone This exercise stretches the muscles that move your hips away from the front of the pelvis (hip extensors). Lie on your abdomen on a firm surface. You can put a pillow under your hips if that is more comfortable. Tense the muscles in your buttocks and lift your left / right leg about 4-6 inches (10-15 cm). Keep your knee straight as you lift your leg. Hold this position for __________ seconds. Slowly lower your leg to the starting position. Let your leg relax completely after each repetition. Repeat __________ times. Complete this exercise __________ times a day. This information is not intended to replace advice given to you by your health care provider. Make sure you discuss any questions you have with your health care provider. Document Revised: 06/06/2021 Document Reviewed: 06/06/2021 [employer] Patient Education  2022 [employer]  

## 2021-12-06 LAB — COMPLETE METABOLIC PANEL WITH GFR
AG Ratio: 1.2 (calc) (ref 1.0–2.5)
ALT: 11 U/L (ref 6–29)
AST: 13 U/L (ref 10–30)
Albumin: 4.1 g/dL (ref 3.6–5.1)
Alkaline phosphatase (APISO): 78 U/L (ref 31–125)
BUN: 8 mg/dL (ref 7–25)
CO2: 26 mmol/L (ref 20–32)
Calcium: 9.3 mg/dL (ref 8.6–10.2)
Chloride: 107 mmol/L (ref 98–110)
Creat: 0.8 mg/dL (ref 0.50–0.97)
Globulin: 3.3 g/dL (calc) (ref 1.9–3.7)
Glucose, Bld: 90 mg/dL (ref 65–99)
Potassium: 5.2 mmol/L (ref 3.5–5.3)
Sodium: 140 mmol/L (ref 135–146)
Total Bilirubin: 0.4 mg/dL (ref 0.2–1.2)
Total Protein: 7.4 g/dL (ref 6.1–8.1)
eGFR: 99 mL/min/{1.73_m2} (ref >=60)

## 2021-12-06 LAB — CBC WITH DIFFERENTIAL/PLATELET
Absolute Monocytes: 361 cells/uL (ref 200–950)
Basophils Absolute: 29 cells/uL (ref 0–200)
Basophils Relative: 0.7 %
Eosinophils Absolute: 59 cells/uL (ref 15–500)
Eosinophils Relative: 1.4 %
HCT: 43.2 % (ref 35.0–45.0)
Hemoglobin: 13.9 g/dL (ref 11.7–15.5)
Lymphs Abs: 1016 cells/uL (ref 850–3900)
MCH: 27.7 pg (ref 27.0–33.0)
MCHC: 32.2 g/dL (ref 32.0–36.0)
MCV: 86.2 fL (ref 80.0–100.0)
MPV: 11 fL (ref 7.5–12.5)
Monocytes Relative: 8.6 %
Neutro Abs: 2734 cells/uL (ref 1500–7800)
Neutrophils Relative %: 65.1 %
Platelets: 230 10*3/uL (ref 140–400)
RBC: 5.01 10*6/uL (ref 3.80–5.10)
RDW: 13.4 % (ref 11.0–15.0)
Total Lymphocyte: 24.2 %
WBC: 4.2 10*3/uL (ref 3.8–10.8)

## 2021-12-06 LAB — C3 AND C4
C3 Complement: 127 mg/dL (ref 83–193)
C4 Complement: 34 mg/dL (ref 15–57)

## 2021-12-06 LAB — URINALYSIS, ROUTINE W REFLEX MICROSCOPIC
Bilirubin Urine: NEGATIVE
Glucose, UA: NEGATIVE
Hgb urine dipstick: NEGATIVE
Ketones, ur: NEGATIVE
Leukocytes,Ua: NEGATIVE
Nitrite: NEGATIVE
Protein, ur: NEGATIVE
Specific Gravity, Urine: 1.024 (ref 1.001–1.035)
pH: 5.5 (ref 5.0–8.0)

## 2021-12-06 LAB — ANTI-DNA ANTIBODY, DOUBLE-STRANDED: ds DNA Ab: 1 IU/mL

## 2021-12-06 LAB — SEDIMENTATION RATE: Sed Rate: 6 mm/h (ref 0–20)

## 2021-12-06 NOTE — Progress Notes (Signed)
CBC and CMP WNL.  ESR WNL.  UA normal.

## 2021-12-06 NOTE — Progress Notes (Signed)
dsDNA is negative.  Complements WNL.  Labs are not consistent with a flare.

## 2022-01-05 DIAGNOSIS — Z124 Encounter for screening for malignant neoplasm of cervix: Secondary | ICD-10-CM | POA: Diagnosis not present

## 2022-01-05 DIAGNOSIS — Z6837 Body mass index (BMI) 37.0-37.9, adult: Secondary | ICD-10-CM | POA: Diagnosis not present

## 2022-01-05 DIAGNOSIS — Z01419 Encounter for gynecological examination (general) (routine) without abnormal findings: Secondary | ICD-10-CM | POA: Diagnosis not present

## 2022-01-09 NOTE — Progress Notes (Deleted)
? ?COMPLETE PHYSICAL ? ?Assessment and Plan: ? ?Lucas was seen today for annual exam. ? ?Diagnoses and all orders for this visit: ? ?Encounter for routine adult medical exam with abnormal findings ?Yearly ? ?Lupus (HCC) ?Continue follow up q 6 month with Rheumatology ?  ?Depression, major, recurrent, in partial remission (HCC) ?Discussed dietary and exercise modifications ? ?Abnormal glucose ?-     Hemoglobin A1c ? ?Raynaud's disease without gangrene ?Managing well at this time ? ?Anemia, unspecified type ?No iron supplementation ? ?Screening cholesterol level ?-     Lipid panel ? ?Screening for blood or protein in urine ?-     Urinalysis routine with reflex microscopic ?- Microablumin/creatinine urine ratio ? ?Screening for thyroid disorder ?-     TSH ? ?Medication management ?-     CBC with Differential/Platelet ?-     COMPLETE METABOLIC PANEL WITH GFR ? ?Vitamin D deficiency ?-     VITAMIN D 25 Hydroxy (Vit-D Deficiency, Fractures) ? ? ? ? ? ?Further disposition pending results if labs check today. Discussed med's effects and SE's.   ?Over 30 minutes of face to face interview, exam, counseling, chart review, and critical decision making was performed.  ? ?Discussed med's effects and SE's. Screening labs and tests as requested with regular follow-up as recommended. ? ? ?HPI  ?36 y.o. female  presents for a complete physical. ? ?She has Lupus and follows with Dr. Corliss Skains every 3 months.  She is taking Plaquenil 100mg  once daily. ? ?She follows with Dr for her womens health.  She is on mirena which is helping. ? ?She is married two children, girls, 10 and 7. ? ?Personal history of COVID 05/2020.  Reports she was out of work for over 3 weeks.  She was extremley fatigued and also has some shortness of breath. She did have post covid pneumonia.  No hospitalization.  She would like antibody levels checked today as she is considering vaccination. ? ? Her blood pressure has been controlled at home, today their BP  is   ?She does not workout regularly but would like to start exercising more.   She denies chest pain, shortness of breath, dizziness.  ?She has raynauds, states has been worse with the winter/cold.  ?Hands and feet and would like to get on medication for me.  ?Patient is on Vitamin D supplement, she is taking 50,000 IU once a week. ? ?Lab Results  ?Component Value Date  ? VD25OH 119 (H) 01/11/2021  ? ? ?BMI is There is no height or weight on file to calculate BMI. ?Wt Readings from Last 3 Encounters:  ?12/05/21 210 lb 3.2 oz (95.3 kg)  ?07/04/21 218 lb 3.2 oz (99 kg)  ?01/31/21 215 lb (97.5 kg)  ?    ?Due to obesity, she has preDM, denies DM poly's.  ?Lab Results  ?Component Value Date  ? HGBA1C 5.2 01/11/2021  ? ?She has a 36 year old and a 35 year old, both girls, competitive dance, are in school daily at private school. She started new job, working from home, was working with her mom but they got into an argument 6-8 months ago.  ? ? ? ?Current Medications:  ?Current Outpatient Medications on File Prior to Visit  ?Medication Sig Dispense Refill  ? cetirizine (ZYRTEC) 5 MG tablet Take 5 mg by mouth daily.    ? fluticasone (FLONASE) 50 MCG/ACT nasal spray Place 2 sprays into both nostrils daily. 16 g 0  ? hydroxychloroquine (PLAQUENIL) 200 MG  tablet TAKE ONE (1) TABLET BY MOUTH EVERY DAY 90 tablet 0  ? omeprazole (PRILOSEC) 40 MG capsule TAKE ONE (1) CAPSULE EACH DAY BY MOUTH FOR INDIGESTION & HEARTBURN 90 capsule 0  ? ?No current facility-administered medications on file prior to visit.  ? ?Health Maintenance:   ?Immunization History  ?Administered Date(s) Administered  ? Influenza Split 08/01/2015  ? Influenza-Unspecified 07/09/2016  ? PPD Test 02/02/2015, 03/15/2015, 04/19/2016  ? Tdap 04/24/2013  ? ?Tetanus: 2014 ?Pneumovax: N/A ?Flu vaccine: did not get this year ?Zostavax: N/A ? ?LMP: No LMP recorded. (Menstrual status: IUD).will have spotting ?Pap: 2012, follows Dr. Rana SnareLowe ?MGM:N/A  ?DEXA: N/A ?Colonoscopy:  N/A ?EGD: N/A ? ?Patient Care Team: ?Lucky CowboyMcKeown, William, MD as PCP - General (Internal Medicine) ?Candice CampLowe, David, MD as Consulting Physician (Obstetrics and Gynecology) ? ?Medical History:  ?Past Medical History:  ?Diagnosis Date  ? Abnormal Pap smear   ? Hx of blood clots 2015  ? on uterus during pregnancy  ? Lupus (HCC)   ? Obesity (BMI 30-39.9)   ? BMI 38  ? Postpartum depression 04/08/2014  ? ?Allergies ?No Known Allergies ? ?SURGICAL HISTORY ?She  has a past surgical history that includes Wisdom tooth extraction. ?FAMILY HISTORY ?Her family history includes Cancer in her maternal grandmother; Heart disease in her paternal grandfather; Lupus in her paternal grandmother. ?SOCIAL HISTORY ?She  reports that she has never smoked. She has never been exposed to tobacco smoke. She has never used smokeless tobacco. She reports that she does not drink alcohol and does not use drugs. ? ? ?Review of Systems  ?Constitutional:  Negative for chills and fever.  ?HENT:  Negative for congestion, hearing loss, sinus pain, sore throat and tinnitus.   ?Eyes:  Negative for blurred vision and double vision.  ?Respiratory:  Negative for cough, hemoptysis, sputum production, shortness of breath and wheezing.   ?Cardiovascular:  Negative for chest pain, palpitations and leg swelling.  ?Gastrointestinal:  Negative for abdominal pain, constipation, diarrhea, heartburn, nausea and vomiting.  ?Genitourinary:  Negative for dysuria and urgency.  ?Musculoskeletal:  Negative for back pain, falls, joint pain, myalgias and neck pain.  ?Skin:  Negative for rash.  ?Neurological:  Negative for dizziness, tingling, tremors, weakness and headaches.  ?Endo/Heme/Allergies:  Does not bruise/bleed easily.  ?Psychiatric/Behavioral:  Negative for depression and suicidal ideas. The patient is not nervous/anxious and does not have insomnia.   ? ?Physical Exam: ?Estimated body mass index is 37.24 kg/m? as calculated from the following: ?  Height as of 12/05/21: 5\' 3"   (1.6 m). ?  Weight as of 12/05/21: 210 lb 3.2 oz (95.3 kg). ?There were no vitals taken for this visit.  ? ?General Appearance: Well nourished, in no apparent distress.  ?Eyes: PERRLA, EOMs, conjunctiva no swelling or erythema, normal fundi and vessels.  ?Sinuses: No Frontal/maxillary tenderness  ?ENT/Mouth: Ext aud canals clear, normal light reflex with TMs without erythema, bulging. Good dentition. No erythema, swelling, or exudate on post pharynx. Tonsils not swollen or erythematous. Hearing normal.  ?Neck: Supple, thyroid fullness, left posterior lymph node enlargement. No bruits  ?Respiratory: Respiratory effort normal, BS equal bilaterally without rales, rhonchi, wheezing or stridor.  ?Cardio: RRR without murmurs, rubs or gallops. Brisk peripheral pulses without edema.  ?Chest: symmetric, with normal excursions and percussion.  ?Breasts:defer ?Abdomen: Soft, nontender, obese, no guarding, rebound, hernias, masses, or organomegaly. Marland Kitchen. ?Lymphatics: no enlargement, nontender ?Genitourinary: defer ?Musculoskeletal: Full ROM all peripheral extremities,5/5 strength, and normal gait.  ?Skin: seb dermatitis on hair/forehead, dry  flaky skin. Warm, dry without rashes, lesions, ecchymosis. ?Neuro: Cranial nerves intact, reflexes equal bilaterally. Normal muscle tone, no cerebellar symptoms. Sensation intact.  ?Psych: Awake and oriented X 3, normal affect, Insight and Judgment appropriate.  ? ?EKG: defer ? ? ?Elder Negus, AGNP-C, DNP ?[facility] ?01/09/2022  8:51 AM ? ? ? ? ? ? ?

## 2022-01-11 ENCOUNTER — Encounter: Payer: BC Managed Care – PPO | Admitting: Nurse Practitioner

## 2022-01-11 DIAGNOSIS — M329 Systemic lupus erythematosus, unspecified: Secondary | ICD-10-CM

## 2022-01-11 DIAGNOSIS — Z1389 Encounter for screening for other disorder: Secondary | ICD-10-CM

## 2022-01-11 DIAGNOSIS — E559 Vitamin D deficiency, unspecified: Secondary | ICD-10-CM

## 2022-01-11 DIAGNOSIS — Z1329 Encounter for screening for other suspected endocrine disorder: Secondary | ICD-10-CM

## 2022-01-11 DIAGNOSIS — F3341 Major depressive disorder, recurrent, in partial remission: Secondary | ICD-10-CM

## 2022-01-11 DIAGNOSIS — R7309 Other abnormal glucose: Secondary | ICD-10-CM

## 2022-01-11 DIAGNOSIS — Z13220 Encounter for screening for lipoid disorders: Secondary | ICD-10-CM

## 2022-01-11 DIAGNOSIS — Z79899 Other long term (current) drug therapy: Secondary | ICD-10-CM

## 2022-01-11 DIAGNOSIS — I7300 Raynaud's syndrome without gangrene: Secondary | ICD-10-CM

## 2022-01-11 DIAGNOSIS — Z136 Encounter for screening for cardiovascular disorders: Secondary | ICD-10-CM

## 2022-01-11 DIAGNOSIS — D649 Anemia, unspecified: Secondary | ICD-10-CM

## 2022-01-11 DIAGNOSIS — Z0001 Encounter for general adult medical examination with abnormal findings: Secondary | ICD-10-CM

## 2022-02-09 ENCOUNTER — Other Ambulatory Visit: Payer: Self-pay | Admitting: Adult Health

## 2022-02-09 DIAGNOSIS — R059 Cough, unspecified: Secondary | ICD-10-CM

## 2022-02-09 MED ORDER — OMEPRAZOLE 40 MG PO CPDR: DELAYED_RELEASE_CAPSULE | ORAL | 0 refills | Status: DC

## 2022-03-31 ENCOUNTER — Other Ambulatory Visit: Payer: Self-pay | Admitting: Physician Assistant

## 2022-03-31 DIAGNOSIS — M3219 Other organ or system involvement in systemic lupus erythematosus: Secondary | ICD-10-CM

## 2022-05-01 NOTE — Progress Notes (Signed)
Office Visit Note  Patient: Jasmine Espinoza             Date of Birth: [birth year]           MRN: [id number]             PCP: Lucky Cowboy, MD Referring: Lucky Cowboy, MD Visit Date: 05/15/2022 Occupation: @GUAROCC @  Subjective:  Medication management  History of Present Illness: Jasmine Espinoza is a 36 y.o. female with history of systemic lupus erythematosus.  She states she has had increased photosensitivity.  Summer.  She was out at the beach only for 1 hour and developed redness all over her body.  She continues to have some fatigue and joint pain.  She also has some raynaud's phenominon only when she is exposed to colder temperatures.  She has noticed improvement in her hair loss.  She states that she used to forget the second dose of hydroxychloroquine and then she switched to hydroxychloroquine 200 mg only once a day.  Activities of Daily Living:  Patient reports morning stiffness for 1 hour.   Patient Denies nocturnal pain.  Difficulty dressing/grooming: Denies Difficulty climbing stairs: Reports Difficulty getting out of chair: Denies Difficulty using hands for taps, buttons, cutlery, and/or writing: Reports  Review of Systems  Constitutional:  Positive for fatigue.  HENT:  Negative for mouth sores and mouth dryness.   Eyes:  Negative for dryness.  Respiratory:  Negative for shortness of breath.   Cardiovascular:  Negative for chest pain and palpitations.  Gastrointestinal:  Positive for constipation. Negative for blood in stool and diarrhea.  Endocrine: Negative for increased urination.  Genitourinary:  Negative for involuntary urination.  Musculoskeletal:  Positive for joint pain, joint pain and morning stiffness. Negative for joint swelling, myalgias, muscle weakness, muscle tenderness and myalgias.  Skin:  Positive for color change, rash and sensitivity to sunlight. Negative for hair loss.  Allergic/Immunologic: Positive for susceptible to infections.   Neurological:  Positive for headaches. Negative for dizziness.  Hematological:  Positive for swollen glands.  Psychiatric/Behavioral:  Positive for depressed mood. Negative for sleep disturbance. The patient is not nervous/anxious.     PMFS History:  Patient Active Problem List   Diagnosis Date Noted   Lupus (HCC) 05/25/2020   Depression, major, recurrent, in partial remission (HCC) 05/25/2020   Obesity 07/27/2014   Postpartum depression 04/08/2014    Past Medical History:  Diagnosis Date   Abnormal Pap smear    Hx of blood clots 2015   on uterus during pregnancy   Lupus (HCC)    Obesity (BMI 30-39.9)    BMI 38   Postpartum depression 04/08/2014    Family History  Problem Relation Age of Onset   Cancer Maternal Grandmother        breast, late 42s   Heart disease Paternal Grandfather        does not know him, died when she was4   Lupus Paternal Grandmother    Past Surgical History:  Procedure Laterality Date   WISDOM TOOTH EXTRACTION     Social History   Social History Narrative   Not on file   Immunization History  Administered Date(s) Administered   Influenza Split 08/01/2015   Influenza-Unspecified 07/09/2016   PPD Test 02/02/2015, 03/15/2015, 04/19/2016   Tdap 04/24/2013     Objective: Vital Signs: BP 131/87 (BP Location: Left Arm, Patient Position: Sitting, Cuff Size: Normal)   Pulse 89   Resp 15   Ht 5\' 3"  (1.6 m)  Wt 221 lb (100.2 kg)   BMI 39.15 kg/m    Physical Exam Vitals and nursing note reviewed.  Constitutional:      Appearance: She is well-developed.  HENT:     Head: Normocephalic and atraumatic.  Eyes:     Conjunctiva/sclera: Conjunctivae normal.  Cardiovascular:     Rate and Rhythm: Normal rate and regular rhythm.     Heart sounds: Normal heart sounds.  Pulmonary:     Effort: Pulmonary effort is normal.     Breath sounds: Normal breath sounds.  Abdominal:     General: Bowel sounds are normal.     Palpations: Abdomen is soft.   Musculoskeletal:     Cervical back: Normal range of motion.  Lymphadenopathy:     Cervical: No cervical adenopathy.  Skin:    General: Skin is warm and dry.     Capillary Refill: Capillary refill takes less than 2 seconds.  Neurological:     Mental Status: She is alert and oriented to person, place, and time.  Psychiatric:        Behavior: Behavior normal.      Musculoskeletal Exam: C-spine thoracic and lumbar spine were in good range of motion.  Shoulder joints, elbow joints, wrist joints, MCPs PIPs and DIPs with good range of motion with no synovitis.  Hip joints, knee joints, ankles, MTPs and PIPs with good range of motion with no synovitis.  CDAI Exam: CDAI Score: -- Patient Global: --; Provider Global: -- Swollen: --; Tender: -- Joint Exam 05/15/2022   No joint exam has been documented for this visit   There is currently no information documented on the homunculus. Go to the Rheumatology activity and complete the homunculus joint exam.  Investigation: No additional findings.  Imaging: No results found.  Recent Labs: Lab Results  Component Value Date   WBC 4.2 12/05/2021   HGB 13.9 12/05/2021   PLT 230 12/05/2021   NA 140 12/05/2021   K 5.2 12/05/2021   CL 107 12/05/2021   CO2 26 12/05/2021   GLUCOSE 90 12/05/2021   BUN 8 12/05/2021   CREATININE 0.80 12/05/2021   BILITOT 0.4 12/05/2021   ALKPHOS 91 08/01/2016   AST 13 12/05/2021   ALT 11 12/05/2021   PROT 7.4 12/05/2021   ALBUMIN 3.9 08/01/2016   CALCIUM 9.3 12/05/2021   GFRAA 135 01/11/2021   QFTBGOLDPLUS NEGATIVE 01/11/2020    Speciality Comments: PLQ Eye Exam: 10/21/2020 WNL @ [facility] Charmian [place] Follow up in 12 months  Procedures:  No procedures performed Allergies: Patient has no known allergies.   Assessment / Plan:     Visit Diagnoses: Other organ or system involvement in systemic lupus erythematosus (HCC) - Fatigue, arthritis, malar rash, Raynaud's, photosensitivity, +ANA, +Smith, + RNP, +  Ro, history of DVT during pregnancy, family history of lupus:  -Patient continues to have pain and stiffness in some of her joints but no joint swelling.  She has noticed increased photosensitivity recently.  She denies any history of malar rash, joint inflammation.  She continues to have raynaud's phenominon.  Hair loss has improved.  She has been taking hydroxychloroquine 200 mg only once a day.  We discussed increasing the dose of hydroxychloroquine 200 mg p.o. twice daily Monday to Friday.  She was in agreement.  She states her compliance was not as good twice a day dosing in the past.  I will obtain autoimmune labs today.  Use of sunscreen and SPF protective clothing were discussed.  Plan:  Protein / creatinine ratio, urine, Anti-DNA antibody, double-stranded, C3 and C4, Sedimentation rate, ANA, RNP Antibody, Sjogrens syndrome-A extractable nuclear antibody, Anti-Smith antibody  High risk medication use - Plaquenil 200 mg 1 tablet by mouth daily. PLQ Eye Exam: 10/21/2020 - Plan: CBC with Differential/Platelet, COMPLETE METABOLIC PANEL WITH GFR.  Information regarding the immunization was placed in the AVS.  Raynaud's syndrome without gangrene-protection from colder temperatures was discussed.  Hair loss-improved.  History of blood clots - DVT 2013 and 2014 during pregnancy.  Anticardiolipin, beta-2 GP 1 and lupus anticoagulant were negative.  Chronic pain of both knees -she continues to have some discomfort in her knee joints.  Lower extremity muscle strength exercises were discussed.  X-rays of both knees were unremarkable on 01/11/20.  Vitamin D deficiency -she had history of vitamin D deficiency in the past.  She has been off vitamin D as her levels became high.  Plan: VITAMIN D 25 Hydroxy (Vit-D Deficiency, Fractures)  Other medical problems are tested as follows:  Seasonal allergies  History of gastroesophageal reflux (GERD)  Family history of systemic lupus erythematosus  Orders: Orders  Placed This Encounter  Procedures   Protein / creatinine ratio, urine   CBC with Differential/Platelet   COMPLETE METABOLIC PANEL WITH GFR   Anti-DNA antibody, double-stranded   C3 and C4   Sedimentation rate   ANA   RNP Antibody   Sjogrens syndrome-A extractable nuclear antibody   Anti-Smith antibody   VITAMIN D 25 Hydroxy (Vit-D Deficiency, Fractures)   No orders of the defined types were placed in this encounter.  .  Follow-Up Instructions: Return in about 5 months (around 10/15/2022) for Systemic lupus.   Pollyann Savoy, MD  Note - This record has been created using Animal nutritionist.  Chart creation errors have been sought, but may not always  have been located. Such creation errors do not reflect on  the standard of medical care.

## 2022-05-08 NOTE — Progress Notes (Deleted)
COMPLETE PHYSICAL  Assessment and Plan:  Jasmine Espinoza was seen today for annual exam.  Diagnoses and all orders for this visit:  Encounter for routine adult medical exam with abnormal findings Yearly  Lupus (HCC) Continue follow up !75month with Rheumatology   Depression, major, recurrent, in partial remission (HCC) Discussed dietary and exercise modifications  Abnormal glucose -     Hemoglobin A1c  Raynaud's disease without gangrene Managing well at this time  Anemia, unspecified type No iron supplementation  Screening cholesterol level -     Lipid panel  Screening for blood or protein in urine -     Urinalysis w microscopic + reflex cultur  Screening for thyroid disorder -     TSH  Medication management -     CBC with Differential/Platelet -     COMPLETE METABOLIC PANEL WITH GFR  Vitamin D deficiency -     VITAMIN D 25 Hydroxy (Vit-D Deficiency, Fractures)     Further disposition pending results if labs check today. Discussed med's effects and SE's.   Over 30 minutes of face to face interview, exam, counseling, chart review, and critical decision making was performed.   Discussed med's effects and SE's. Screening labs and tests as requested with regular follow-up as recommended.   HPI  36 y.o. female  presents for a complete physical.  She has Lupus and follows with Dr. Corliss Skains every 3 months.  She is taking Plaquenil 100mg  once daily.  She follows with Dr for her womens health.  She is on mirena which is helping.  She is married two children, girls, 10 and 7.  Personal history of COVID 05/2020.  Reports she was out of work for over 3 weeks.  She was extremley fatigued and also has some shortness of breath. She did have post covid pneumonia.  No hospitalization.  She would like antibody levels checked today as she is considering vaccination.   Her blood pressure has been controlled at home, today their BP is   She does not workout regularly but  would like to start exercising more.   She denies chest pain, shortness of breath, dizziness.  She has raynauds, states has been worse with the winter/cold.  Hands and feet and would like to get on medication for me.  Patient is on Vitamin D supplement, she is taking 50,000 IU once a week.  Lab Results  Component Value Date   VD25OH 119 (H) 01/11/2021    BMI is There is no height or weight on file to calculate BMI. Wt Readings from Last 3 Encounters:  12/05/21 210 lb 3.2 oz (95.3 kg)  07/04/21 218 lb 3.2 oz (99 kg)  01/31/21 215 lb (97.5 kg)      Due to obesity, she has preDM, denies DM poly's.  Lab Results  Component Value Date   HGBA1C 5.2 01/11/2021   She has a 35 year old and a 35 year old, both girls, competitive dance, are in school daily at private school. She started new job, working from home, was working with her mom but they got into an argument 6-8 months ago.     Current Medications:  Current Outpatient Medications on File Prior to Visit  Medication Sig Dispense Refill   cetirizine (ZYRTEC) 5 MG tablet Take 5 mg by mouth daily.     fluticasone (FLONASE) 50 MCG/ACT nasal spray Place 2 sprays into both nostrils daily. 16 g 0   hydroxychloroquine (PLAQUENIL) 200 MG tablet TAKE ONE (1) TABLET BY  MOUTH EVERY DAY 30 tablet 0   omeprazole (PRILOSEC) 40 MG capsule TAKE ONE (1) CAPSULE EACH DAY BY MOUTH FOR INDIGESTION & HEARTBURN 90 capsule 0   No current facility-administered medications on file prior to visit.   Health Maintenance:   Immunization History  Administered Date(s) Administered   Influenza Split 08/01/2015   Influenza-Unspecified 07/09/2016   PPD Test 02/02/2015, 03/15/2015, 04/19/2016   Tdap 04/24/2013   Tetanus: 2014 Pneumovax: N/A Flu vaccine: did not get this year Zostavax: N/A  LMP: No LMP recorded. (Menstrual status: IUD).will have spotting Pap: 2012, follows Dr. Rana Snare MGM:N/A  DEXA: N/A Colonoscopy: N/A EGD: N/A  Patient Care  Team: Lucky Cowboy, MD as PCP - General (Internal Medicine) Candice Camp, MD as Consulting Physician (Obstetrics and Gynecology)  Medical History:  Past Medical History:  Diagnosis Date   Abnormal Pap smear    Hx of blood clots 2015   on uterus during pregnancy   Lupus (HCC)    Obesity (BMI 30-39.9)    BMI 38   Postpartum depression 04/08/2014   Allergies No Known Allergies  SURGICAL HISTORY She  has a past surgical history that includes Wisdom tooth extraction. FAMILY HISTORY Her family history includes Cancer in her maternal grandmother; Heart disease in her paternal grandfather; Lupus in her paternal grandmother. SOCIAL HISTORY She  reports that she has never smoked. She has never been exposed to tobacco smoke. She has never used smokeless tobacco. She reports that she does not drink alcohol and does not use drugs.   Review of Systems  Constitutional:  Negative for chills, diaphoresis, fever, malaise/fatigue and weight loss.  HENT:  Negative for congestion, ear discharge, ear pain, hearing loss, nosebleeds and tinnitus.   Eyes:  Negative for blurred vision, double vision, photophobia and pain.  Respiratory:  Negative for cough, hemoptysis, sputum production, shortness of breath and wheezing.   Cardiovascular:  Negative for chest pain, palpitations, orthopnea, claudication, leg swelling and PND.  Gastrointestinal:  Positive for heartburn. Negative for abdominal pain, blood in stool, constipation, diarrhea, melena, nausea and vomiting.  Genitourinary:  Negative for dysuria, flank pain, frequency, hematuria and urgency.  Musculoskeletal:  Negative for back pain, falls, joint pain, myalgias and neck pain.  Skin:  Negative for itching and rash.  Neurological:  Negative for dizziness, tingling, tremors, sensory change, speech change, focal weakness, weakness and headaches.  Endo/Heme/Allergies:  Negative for environmental allergies and polydipsia. Does not bruise/bleed easily.   Psychiatric/Behavioral: Negative.  Negative for depression, hallucinations, substance abuse and suicidal ideas. The patient is not nervous/anxious and does not have insomnia.     Physical Exam: Estimated body mass index is 37.24 kg/m as calculated from the following:   Height as of 12/05/21: 5\' 3"  (1.6 m).   Weight as of 12/05/21: 210 lb 3.2 oz (95.3 kg). There were no vitals taken for this visit.   General Appearance: Well nourished, in no apparent distress.  Eyes: PERRLA, EOMs, conjunctiva no swelling or erythema, normal fundi and vessels.  Sinuses: No Frontal/maxillary tenderness  ENT/Mouth: Ext aud canals clear, normal light reflex with TMs without erythema, bulging. Good dentition. No erythema, swelling, or exudate on post pharynx. Tonsils not swollen or erythematous. Hearing normal.  Neck: Supple, thyroid fullness, left posterior lymph node enlargement. No bruits  Respiratory: Respiratory effort normal, BS equal bilaterally without rales, rhonchi, wheezing or stridor.  Cardio: RRR without murmurs, rubs or gallops. Brisk peripheral pulses without edema.  Chest: symmetric, with normal excursions and percussion.  Breasts:defer Abdomen: Soft,  nontender, obese, no guarding, rebound, hernias, masses, or organomegaly. . Lymphatics: no enlargement, nontender Genitourinary: defer Musculoskeletal: Full ROM all peripheral extremities,5/5 strength, and normal gait.  Skin: seb dermatitis on hair/forehead, dry flaky skin. Warm, dry without rashes, lesions, ecchymosis. Neuro: Cranial nerves intact, reflexes equal bilaterally. Normal muscle tone, no cerebellar symptoms. Sensation intact.  Psych: Awake and oriented X 3, normal affect, Insight and Judgment appropriate.   EKG: defer   [facility]  05/08/2022

## 2022-05-09 ENCOUNTER — Encounter: Payer: Self-pay | Admitting: Nurse Practitioner

## 2022-05-11 ENCOUNTER — Other Ambulatory Visit: Payer: Self-pay | Admitting: Physician Assistant

## 2022-05-11 DIAGNOSIS — M3219 Other organ or system involvement in systemic lupus erythematosus: Secondary | ICD-10-CM

## 2022-05-11 NOTE — Telephone Encounter (Signed)
Next Visit: 05/15/2022   Last Visit: 12/05/2021   Labs: 03/04/2022 CBC and CMP WNL   Eye exam: 10/21/2020 WNL     Current Dose per office note 12/05/2021: Plaquenil 200 mg 1 tablet by mouth daily   [name] organ or system involvement in systemic lupus erythematosus   Last Fill: 12/05/2021   Patient states she has an appointment scheduled for 06/15/2022 for PLQ eye exam at [facility] Dr. Algis Downs patient we prefer Ophthalmologist. Referral placed.    Okay to refill Plaquenil?

## 2022-05-15 ENCOUNTER — Ambulatory Visit: Payer: BC Managed Care – PPO | Attending: Rheumatology | Admitting: Rheumatology

## 2022-05-15 ENCOUNTER — Encounter: Payer: Self-pay | Admitting: Rheumatology

## 2022-05-15 VITALS — BP 131/87 | HR 89 | Resp 15 | Ht 63.0 in | Wt 221.0 lb

## 2022-05-15 DIAGNOSIS — E559 Vitamin D deficiency, unspecified: Secondary | ICD-10-CM | POA: Diagnosis not present

## 2022-05-15 DIAGNOSIS — Z86718 Personal history of other venous thrombosis and embolism: Secondary | ICD-10-CM

## 2022-05-15 DIAGNOSIS — L659 Nonscarring hair loss, unspecified: Secondary | ICD-10-CM

## 2022-05-15 DIAGNOSIS — J302 Other seasonal allergic rhinitis: Secondary | ICD-10-CM

## 2022-05-15 DIAGNOSIS — M25561 Pain in right knee: Secondary | ICD-10-CM

## 2022-05-15 DIAGNOSIS — M3219 Other organ or system involvement in systemic lupus erythematosus: Secondary | ICD-10-CM | POA: Diagnosis not present

## 2022-05-15 DIAGNOSIS — Z79899 Other long term (current) drug therapy: Secondary | ICD-10-CM | POA: Diagnosis not present

## 2022-05-15 DIAGNOSIS — I7300 Raynaud's syndrome without gangrene: Secondary | ICD-10-CM | POA: Diagnosis not present

## 2022-05-15 DIAGNOSIS — M25562 Pain in left knee: Secondary | ICD-10-CM

## 2022-05-15 DIAGNOSIS — Z8719 Personal history of other diseases of the digestive system: Secondary | ICD-10-CM

## 2022-05-15 DIAGNOSIS — Z8269 Family history of other diseases of the musculoskeletal system and connective tissue: Secondary | ICD-10-CM

## 2022-05-15 DIAGNOSIS — G8929 Other chronic pain: Secondary | ICD-10-CM

## 2022-05-15 MED ORDER — HYDROXYCHLOROQUINE SULFATE 200 MG PO TABS: ORAL_TABLET | ORAL | 2 refills | Status: DC

## 2022-05-15 NOTE — Progress Notes (Signed)
CBC WNL

## 2022-05-15 NOTE — Patient Instructions (Addendum)
Vaccines You are taking a medication(s) that can suppress your immune system.  The following immunizations are recommended: Flu annually Covid-19  Td/Tdap (tetanus, diphtheria, pertussis) every 10 years Pneumonia (Prevnar 15 then Pneumovax 23 at least 1 year apart.  Alternatively, can take Prevnar 20 without needing additional dose) Shingrix: 2 doses from 4 weeks to 6 months apart Gardasil  Please check with your PCP to make sure you are up to date.

## 2022-05-17 LAB — ANTI-NUCLEAR AB-TITER (ANA TITER): ANA Titer 1: 1:1280 {titer} — ABNORMAL HIGH

## 2022-05-17 LAB — CBC WITH DIFFERENTIAL/PLATELET
Absolute Monocytes: 452 cells/uL (ref 200–950)
Basophils Absolute: 22 cells/uL (ref 0–200)
Basophils Relative: 0.5 %
Eosinophils Absolute: 60 cells/uL (ref 15–500)
Eosinophils Relative: 1.4 %
HCT: 43.2 % (ref 35.0–45.0)
Hemoglobin: 14.1 g/dL (ref 11.7–15.5)
Lymphs Abs: 1062 cells/uL (ref 850–3900)
MCH: 28.2 pg (ref 27.0–33.0)
MCHC: 32.6 g/dL (ref 32.0–36.0)
MCV: 86.4 fL (ref 80.0–100.0)
MPV: 10.3 fL (ref 7.5–12.5)
Monocytes Relative: 10.5 %
Neutro Abs: 2705 cells/uL (ref 1500–7800)
Neutrophils Relative %: 62.9 %
Platelets: 258 10*3/uL (ref 140–400)
RBC: 5 10*6/uL (ref 3.80–5.10)
RDW: 13 % (ref 11.0–15.0)
Total Lymphocyte: 24.7 %
WBC: 4.3 10*3/uL (ref 3.8–10.8)

## 2022-05-17 LAB — ANA: Anti Nuclear Antibody (ANA): POSITIVE — AB

## 2022-05-17 LAB — COMPLETE METABOLIC PANEL WITH GFR
AG Ratio: 1.5 (calc) (ref 1.0–2.5)
ALT: 15 U/L (ref 6–29)
AST: 14 U/L (ref 10–30)
Albumin: 4.3 g/dL (ref 3.6–5.1)
Alkaline phosphatase (APISO): 81 U/L (ref 31–125)
BUN: 8 mg/dL (ref 7–25)
CO2: 27 mmol/L (ref 20–32)
Calcium: 9.3 mg/dL (ref 8.6–10.2)
Chloride: 104 mmol/L (ref 98–110)
Creat: 0.73 mg/dL (ref 0.50–0.97)
Globulin: 2.9 g/dL (calc) (ref 1.9–3.7)
Glucose, Bld: 90 mg/dL (ref 65–99)
Potassium: 4.2 mmol/L (ref 3.5–5.3)
Sodium: 138 mmol/L (ref 135–146)
Total Bilirubin: 0.4 mg/dL (ref 0.2–1.2)
Total Protein: 7.2 g/dL (ref 6.1–8.1)
eGFR: 110 mL/min/{1.73_m2} (ref >=60)

## 2022-05-17 LAB — ANTI-DNA ANTIBODY, DOUBLE-STRANDED: ds DNA Ab: 1 IU/mL

## 2022-05-17 LAB — VITAMIN D 25 HYDROXY (VIT D DEFICIENCY, FRACTURES): Vit D, 25-Hydroxy: 34 ng/mL (ref 30–100)

## 2022-05-17 LAB — C3 AND C4
C3 Complement: 123 mg/dL (ref 83–193)
C4 Complement: 29 mg/dL (ref 15–57)

## 2022-05-17 LAB — PROTEIN / CREATININE RATIO, URINE
Creatinine, Urine: 172 mg/dL (ref 20–275)
Protein/Creat Ratio: 58 mg/g creat (ref 24–184)
Protein/Creatinine Ratio: 0.058 mg/mg creat (ref 0.024–0.184)
Total Protein, Urine: 10 mg/dL (ref 5–24)

## 2022-05-17 LAB — SJOGRENS SYNDROME-A EXTRACTABLE NUCLEAR ANTIBODY: SSA (Ro) (ENA) Antibody, IgG: 4.9 AI — AB

## 2022-05-17 LAB — RNP ANTIBODY: Ribonucleic Protein(ENA) Antibody, IgG: >8 AI — AB

## 2022-05-17 LAB — SEDIMENTATION RATE: Sed Rate: 9 mm/h (ref 0–20)

## 2022-05-17 LAB — ANTI-SMITH ANTIBODY: ENA SM Ab Ser-aCnc: <1 AI

## 2022-05-17 NOTE — Progress Notes (Signed)
RNP and SSA antibodies are still positive, urine protein negative, CBC normal, CMP normal, double-stranded DNA negative, complements normal, sed rate normal, Smith negative, vitamin D is low normal.  Labs do not indicate disease flare.  Patient may take vitamin D 2000 units daily.  Patient should increase hydroxychloroquine to 200 mg p.o. twice daily Monday to Friday as discussed during the office visit.

## 2022-06-28 ENCOUNTER — Telehealth: Payer: Self-pay

## 2022-06-28 ENCOUNTER — Other Ambulatory Visit: Payer: Self-pay | Admitting: Nurse Practitioner

## 2022-06-28 DIAGNOSIS — R059 Cough, unspecified: Secondary | ICD-10-CM

## 2022-06-28 NOTE — Telephone Encounter (Signed)
Prior authe for Omeprazole completed and submitted.

## 2022-07-03 NOTE — Telephone Encounter (Signed)
Prior auth denied. 

## 2022-07-15 IMAGING — CT CT ANGIO CHEST
2 of 8 series · 19 of 36 positions shown · IV contrast (Omnipaque)
Comparison: Chest radiography same day

CLINICAL DATA: Coronavirus infection. Shortness of breath. Elevated
D-dimer.

EXAM:
CT ANGIOGRAPHY CHEST WITH CONTRAST
TECHNIQUE: Multidetector CT imaging of the chest was performed using the
standard protocol during bolus administration of intravenous
contrast. Multiplanar CT image reconstructions and MIPs were
obtained to evaluate the vascular anatomy.
CONTRAST:  100mL OMNIPAQUE IOHEXOL 350 MG/ML SOLN

[Series 6: pe coronal mpr · coronal · 0.56mm/px · 1 of 91 slices shown]
[im 46/91  mediastinal]
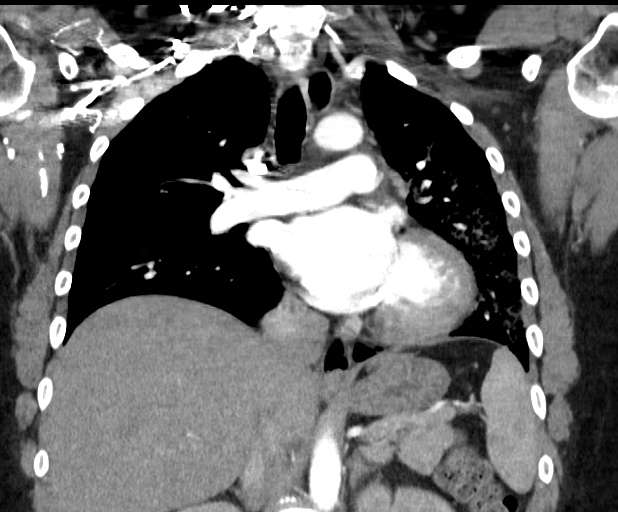

[Series 10: pe thins · axial · 0.69mm/px · z∈[-178,+73]mm · 18 of 373 slices shown]
[im 19/373  lung]
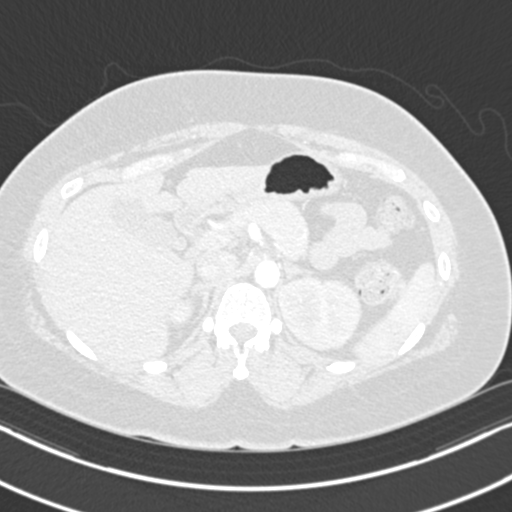
[im 38/373  mediastinal]
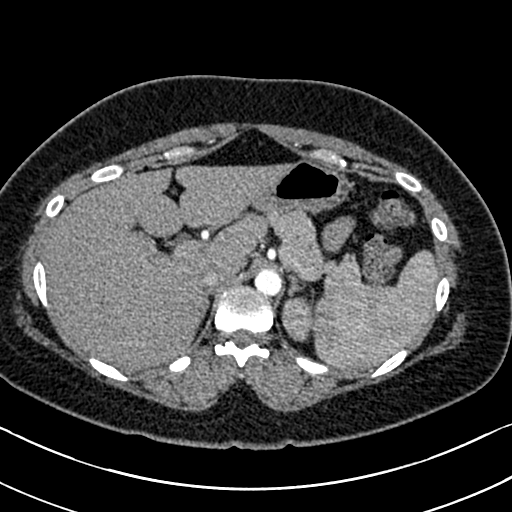
[im 56/373  lung]
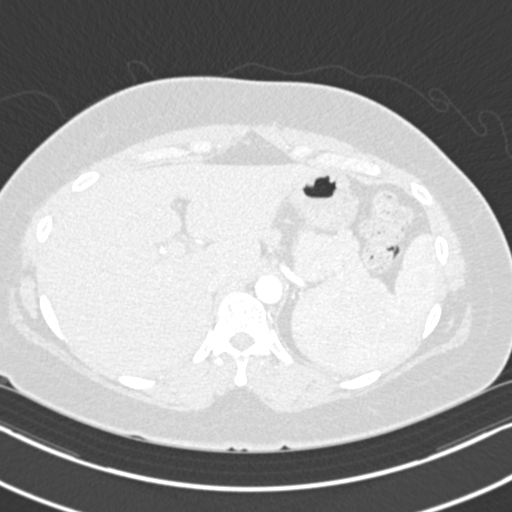
[im 75/373  mediastinal]
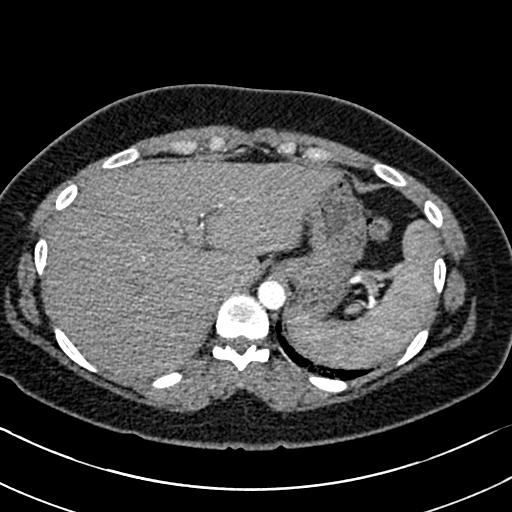
[im 94/373  lung]
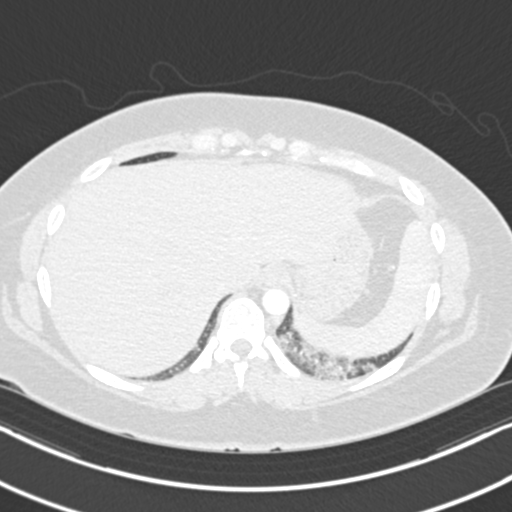
[im 112/373  mediastinal]
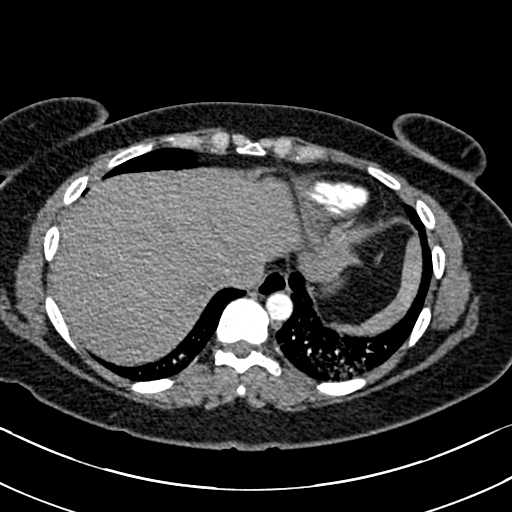
[im 131/373  lung]
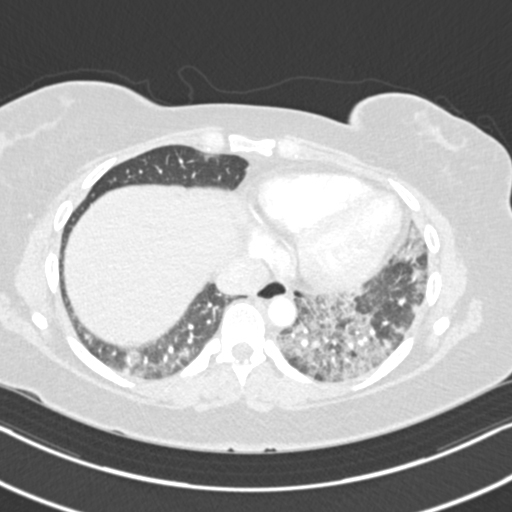
[im 149/373  mediastinal]
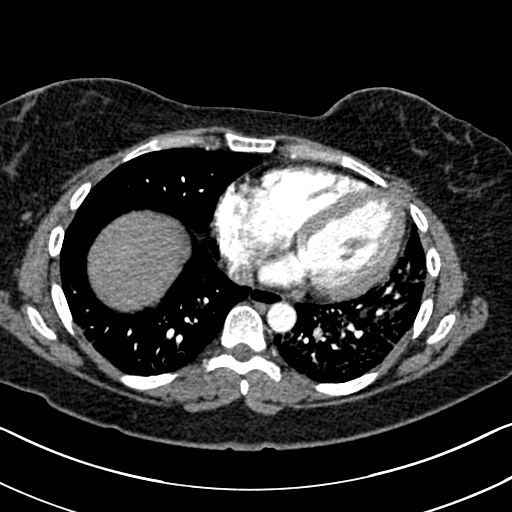
[im 168/373  lung]
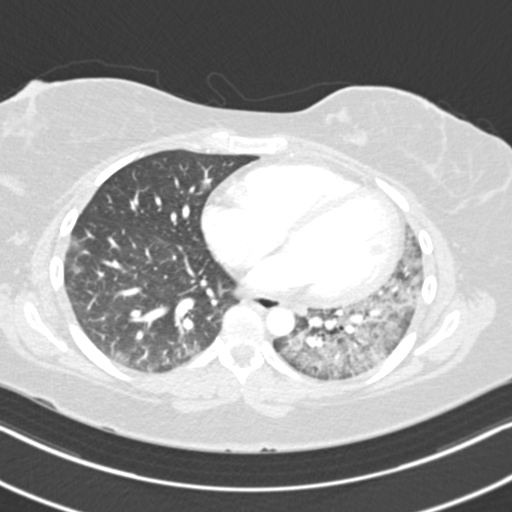
[im 205/373  mediastinal]
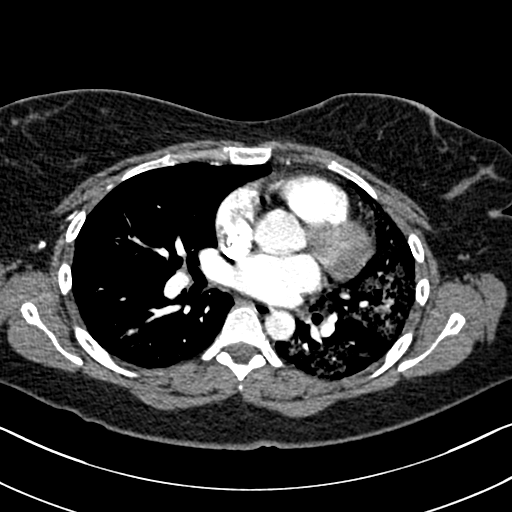
[im 224/373  lung]
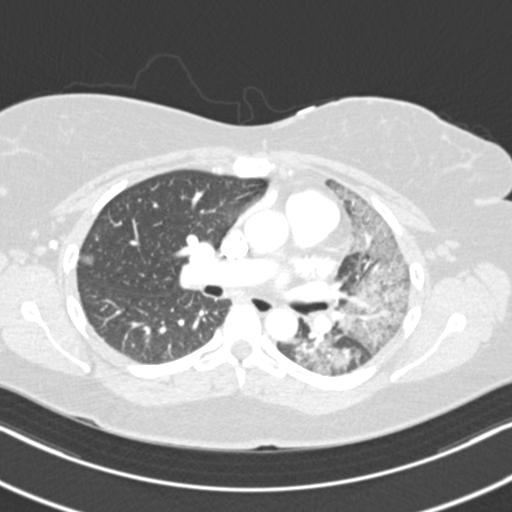
[im 242/373  mediastinal]
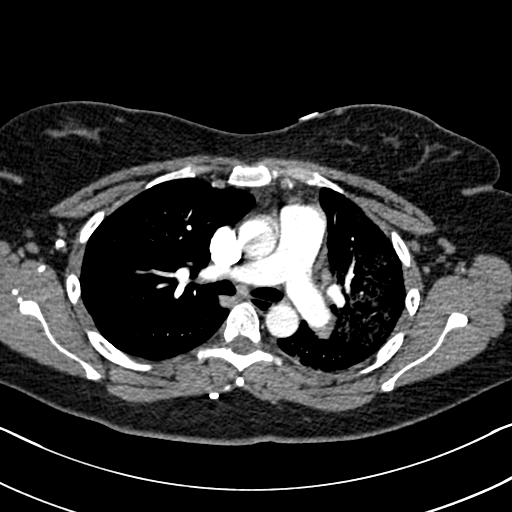
[im 261/373  lung]
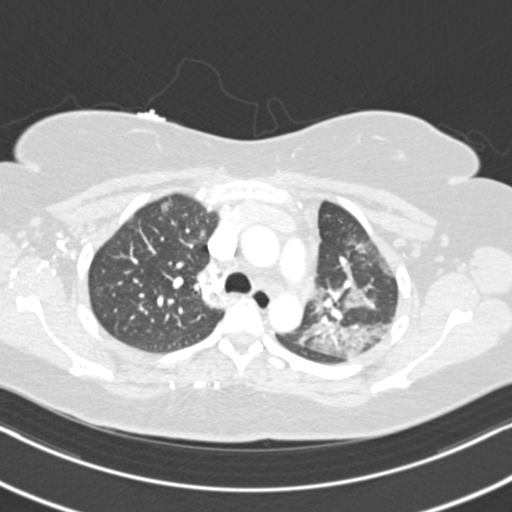
[im 280/373  mediastinal]
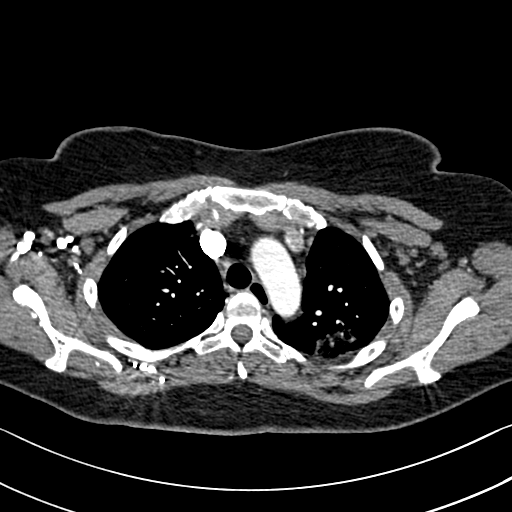
[im 298/373  lung]
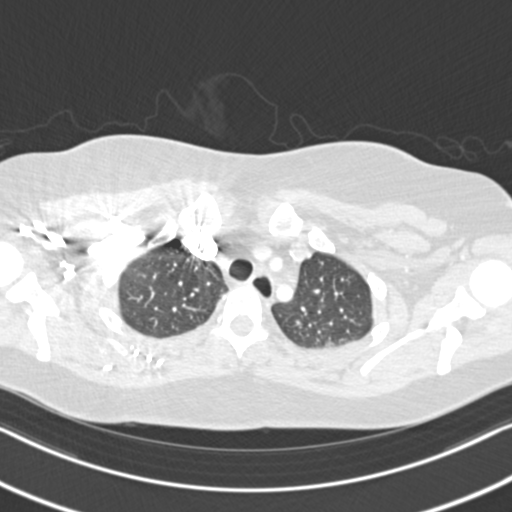
[im 317/373  mediastinal]
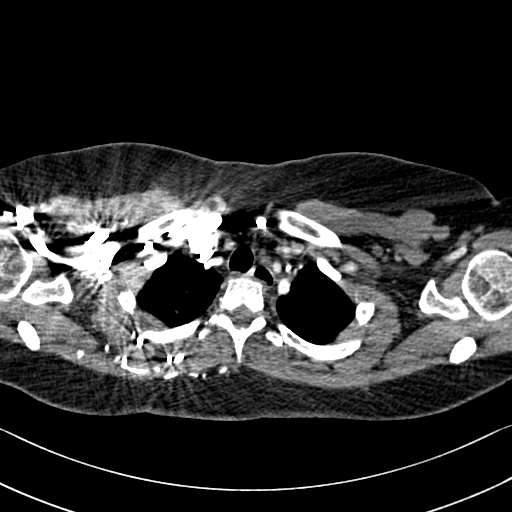
[im 335/373  lung]
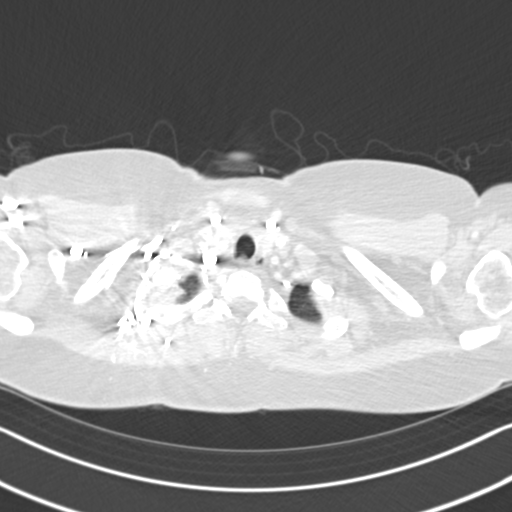
[im 354/373  mediastinal]
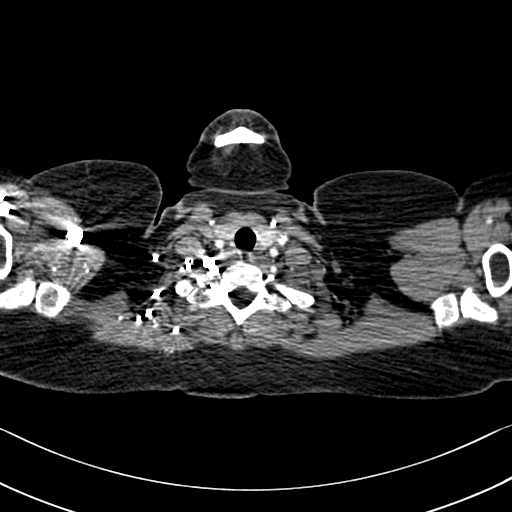

[19 of 36 positions shown; findings below may reference images not displayed]

FINDINGS: Cardiovascular: Pulmonary arterial opacification is good. There are
no pulmonary emboli. Heart size is normal. No pericardial effusion.

Mediastinum/Nodes: Normal

Lungs/Pleura: There is hazy and patchy pulmonary infiltrate
asymmetrically much more marked in the left lung than the right but
definitely present on both sides. Findings consistent with viral
pneumonia. No pleural effusion. No lobar collapse.

Upper Abdomen: Normal

Musculoskeletal: Normal

Review of the MIP images confirms the above findings.
IMPRESSION: No pulmonary emboli.

Widespread hazy and patchy pulmonary infiltrates asymmetrically more
pronounced in the left lung than the right but definitely bilateral,
consistent with viral pneumonia. No lobar collapse or pleural
effusion.

## 2022-09-04 ENCOUNTER — Other Ambulatory Visit: Payer: Self-pay | Admitting: Rheumatology

## 2022-09-04 DIAGNOSIS — M3219 Other organ or system involvement in systemic lupus erythematosus: Secondary | ICD-10-CM

## 2022-09-04 NOTE — Telephone Encounter (Deleted)
ref

## 2022-09-05 ENCOUNTER — Telehealth: Payer: Self-pay

## 2022-09-05 ENCOUNTER — Other Ambulatory Visit: Payer: Self-pay | Admitting: *Deleted

## 2022-09-05 DIAGNOSIS — M3219 Other organ or system involvement in systemic lupus erythematosus: Secondary | ICD-10-CM

## 2022-09-05 NOTE — Telephone Encounter (Signed)
Patient called the office and left message to ask why her prescription for PLQ was denied. Patient advised her prescription was denied because she has not a PLQ eye exam in almost 2 years. Patient states she intends on scheduling an eye exam she just does not have the $100 to do so right now.   Discussed with Dr. Corliss Skains and she advised to schedule patient for a visit to discuss treatment options such as Imuran so patient will not have to worry about the eye exam any longer.

## 2022-09-05 NOTE — Telephone Encounter (Signed)
Omeprazole prior auth completed and submitted.

## 2022-09-06 NOTE — Telephone Encounter (Signed)
Patient LMOM to return call, [facility] for patient to call office to discuss message from Dr. Corliss Skains.

## 2022-09-06 NOTE — Telephone Encounter (Signed)
Attempted to contact the patient and left message for patient to call the office.  

## 2022-09-06 NOTE — Telephone Encounter (Signed)
Ok to refill plaquenil until her follow up visit.

## 2022-09-06 NOTE — Telephone Encounter (Signed)
Patient returned call to the office. Patient has a follow up on 10/17/2022. She would like to know if we could give her enough PLQ to last her until her appointment at which time we can discuss other treatment options. Please advise.

## 2022-09-07 ENCOUNTER — Other Ambulatory Visit: Payer: Self-pay | Admitting: Rheumatology

## 2022-09-07 DIAGNOSIS — M3219 Other organ or system involvement in systemic lupus erythematosus: Secondary | ICD-10-CM

## 2022-09-07 MED ORDER — HYDROXYCHLOROQUINE SULFATE 200 MG PO TABS: ORAL_TABLET | ORAL | 1 refills | Status: DC

## 2022-09-07 NOTE — Addendum Note (Signed)
Addended by: Henriette Combs on: 09/07/2022 08:22 AM   Modules accepted: Orders

## 2022-10-12 NOTE — Progress Notes (Unsigned)
Office Visit Note  Patient: Jasmine Espinoza             Date of Birth: [birth year]           MRN: [id number]             PCP: Unk Pinto, MD Referring: Unk Pinto, MD Visit Date: 10/17/2022 Occupation: @GUAROCC @  Subjective:  No chief complaint on file.   History of Present Illness: Jasmine Espinoza is a 37 y.o. female ***     Activities of Daily Living:  Patient reports morning stiffness for *** {minute/hour:19697}.   Patient {ACTIONS;DENIES/REPORTS:21021675::"Denies"} nocturnal pain.  Difficulty dressing/grooming: {ACTIONS;DENIES/REPORTS:21021675::"Denies"} Difficulty climbing stairs: {ACTIONS;DENIES/REPORTS:21021675::"Denies"} Difficulty getting out of chair: {ACTIONS;DENIES/REPORTS:21021675::"Denies"} Difficulty using hands for taps, buttons, cutlery, and/or writing: {ACTIONS;DENIES/REPORTS:21021675::"Denies"}  No Rheumatology ROS completed.   PMFS History:  Patient Active Problem List   Diagnosis Date Noted   Lupus ([place]) 05/25/2020   Depression, major, recurrent, in partial remission ([place]) 05/25/2020   Obesity 07/27/2014   Postpartum depression 04/08/2014    Past Medical History:  Diagnosis Date   Abnormal Pap smear    Hx of blood clots 2015   on uterus during pregnancy   Lupus ([place])    Obesity (BMI 30-39.9)    BMI 38   Postpartum depression 04/08/2014    Family History  Problem Relation Age of Onset   Cancer Maternal Grandmother        breast, late 48s   Heart disease Paternal Grandfather        does not know him, died when she was75   Lupus Paternal Grandmother    Past Surgical History:  Procedure Laterality Date   WISDOM TOOTH EXTRACTION     Social History   Social History Narrative   Not on file   Immunization History  Administered Date(s) Administered   Influenza Split 08/01/2015   Influenza-Unspecified 07/09/2016   PPD Test 02/02/2015, 03/15/2015, 04/19/2016   Tdap 04/24/2013     Objective: Vital Signs: There were no  vitals taken for this visit.   Physical Exam   Musculoskeletal Exam: ***  CDAI Exam: CDAI Score: -- Patient Global: --; Provider Global: -- Swollen: --; Tender: -- Joint Exam 10/17/2022   No joint exam has been documented for this visit   There is currently no information documented on the homunculus. Go to the Rheumatology activity and complete the homunculus joint exam.  Investigation: No additional findings.  Imaging: No results found.  Recent Labs: Lab Results  Component Value Date   WBC 4.3 05/15/2022   HGB 14.1 05/15/2022   PLT 258 05/15/2022   NA 138 05/15/2022   K 4.2 05/15/2022   CL 104 05/15/2022   CO2 27 05/15/2022   GLUCOSE 90 05/15/2022   BUN 8 05/15/2022   CREATININE 0.73 05/15/2022   BILITOT 0.4 05/15/2022   ALKPHOS 91 08/01/2016   AST 14 05/15/2022   ALT 15 05/15/2022   PROT 7.2 05/15/2022   ALBUMIN 3.9 08/01/2016   CALCIUM 9.3 05/15/2022   GFRAA 135 01/11/2021   QFTBGOLDPLUS NEGATIVE 01/11/2020    Speciality Comments: PLQ Eye Exam: 10/21/2020 WNL @ [facility] [place] Follow up in 12 months  Procedures:  No procedures performed Allergies: Patient has no known allergies.   Assessment / Plan:     Visit Diagnoses: Other organ or system involvement in systemic lupus erythematosus ([place])  High risk medication use  Raynaud's syndrome without gangrene  Hair loss  History of blood clots  Chronic pain of  both knees  Vitamin D deficiency  Seasonal allergies  History of gastroesophageal reflux (GERD)  Family history of systemic lupus erythematosus  Orders: No orders of the defined types were placed in this encounter.  No orders of the defined types were placed in this encounter.   Face-to-face time spent with patient was *** minutes. Greater than 50% of time was spent in counseling and coordination of care.  Follow-Up Instructions: No follow-ups on file.   Ofilia Neas, PA-C  Note - This record has been created using Dragon  software.  Chart creation errors have been sought, but may not always  have been located. Such creation errors do not reflect on  the standard of medical care.

## 2022-10-17 ENCOUNTER — Ambulatory Visit: Payer: BC Managed Care – PPO | Attending: Physician Assistant | Admitting: Physician Assistant

## 2022-10-17 ENCOUNTER — Encounter: Payer: Self-pay | Admitting: Physician Assistant

## 2022-10-17 VITALS — BP 125/85 | HR 94 | Resp 15 | Ht 63.0 in | Wt 231.8 lb

## 2022-10-17 DIAGNOSIS — L659 Nonscarring hair loss, unspecified: Secondary | ICD-10-CM | POA: Diagnosis not present

## 2022-10-17 DIAGNOSIS — M25562 Pain in left knee: Secondary | ICD-10-CM

## 2022-10-17 DIAGNOSIS — G8929 Other chronic pain: Secondary | ICD-10-CM

## 2022-10-17 DIAGNOSIS — I7300 Raynaud's syndrome without gangrene: Secondary | ICD-10-CM | POA: Diagnosis not present

## 2022-10-17 DIAGNOSIS — Z79899 Other long term (current) drug therapy: Secondary | ICD-10-CM

## 2022-10-17 DIAGNOSIS — Z8719 Personal history of other diseases of the digestive system: Secondary | ICD-10-CM

## 2022-10-17 DIAGNOSIS — Z8269 Family history of other diseases of the musculoskeletal system and connective tissue: Secondary | ICD-10-CM

## 2022-10-17 DIAGNOSIS — M3219 Other organ or system involvement in systemic lupus erythematosus: Secondary | ICD-10-CM

## 2022-10-17 DIAGNOSIS — J302 Other seasonal allergic rhinitis: Secondary | ICD-10-CM

## 2022-10-17 DIAGNOSIS — M25561 Pain in right knee: Secondary | ICD-10-CM

## 2022-10-17 DIAGNOSIS — Z86718 Personal history of other venous thrombosis and embolism: Secondary | ICD-10-CM

## 2022-10-17 DIAGNOSIS — E559 Vitamin D deficiency, unspecified: Secondary | ICD-10-CM

## 2022-10-17 MED ORDER — HYDROXYCHLOROQUINE SULFATE 200 MG PO TABS: ORAL_TABLET | ORAL | 0 refills | Status: AC

## 2022-10-17 NOTE — Addendum Note (Signed)
Addended by: Earnestine Mealing on: 10/17/2022 11:38 AM   Modules accepted: Orders

## 2022-10-17 NOTE — Progress Notes (Signed)
CBC WNL

## 2022-10-18 LAB — CBC WITH DIFFERENTIAL/PLATELET
Absolute Monocytes: 320 cells/uL (ref 200–950)
Basophils Absolute: 20 cells/uL (ref 0–200)
Basophils Relative: 0.5 %
Eosinophils Absolute: 51 cells/uL (ref 15–500)
Eosinophils Relative: 1.3 %
HCT: 42 % (ref 35.0–45.0)
Hemoglobin: 13.8 g/dL (ref 11.7–15.5)
Lymphs Abs: 928 cells/uL (ref 850–3900)
MCH: 28 pg (ref 27.0–33.0)
MCHC: 32.9 g/dL (ref 32.0–36.0)
MCV: 85.2 fL (ref 80.0–100.0)
MPV: 10.6 fL (ref 7.5–12.5)
Monocytes Relative: 8.2 %
Neutro Abs: 2582 cells/uL (ref 1500–7800)
Neutrophils Relative %: 66.2 %
Platelets: 256 10*3/uL (ref 140–400)
RBC: 4.93 10*6/uL (ref 3.80–5.10)
RDW: 13 % (ref 11.0–15.0)
Total Lymphocyte: 23.8 %
WBC: 3.9 10*3/uL (ref 3.8–10.8)

## 2022-10-18 LAB — COMPLETE METABOLIC PANEL WITH GFR
AG Ratio: 1.2 (calc) (ref 1.0–2.5)
ALT: 15 U/L (ref 6–29)
AST: 12 U/L (ref 10–30)
Albumin: 4.1 g/dL (ref 3.6–5.1)
Alkaline phosphatase (APISO): 89 U/L (ref 31–125)
BUN: 10 mg/dL (ref 7–25)
CO2: 26 mmol/L (ref 20–32)
Calcium: 9.4 mg/dL (ref 8.6–10.2)
Chloride: 104 mmol/L (ref 98–110)
Creat: 0.72 mg/dL (ref 0.50–0.97)
Globulin: 3.4 g/dL (calc) (ref 1.9–3.7)
Glucose, Bld: 81 mg/dL (ref 65–99)
Potassium: 4.4 mmol/L (ref 3.5–5.3)
Sodium: 139 mmol/L (ref 135–146)
Total Bilirubin: 0.3 mg/dL (ref 0.2–1.2)
Total Protein: 7.5 g/dL (ref 6.1–8.1)
eGFR: 112 mL/min/{1.73_m2} (ref >=60)

## 2022-10-18 LAB — URINALYSIS, ROUTINE W REFLEX MICROSCOPIC
Bilirubin Urine: NEGATIVE
Glucose, UA: NEGATIVE
Hgb urine dipstick: NEGATIVE
Ketones, ur: NEGATIVE
Leukocytes,Ua: NEGATIVE
Nitrite: NEGATIVE
Protein, ur: NEGATIVE
Specific Gravity, Urine: 1.018 (ref 1.001–1.035)
pH: 6.5 (ref 5.0–8.0)

## 2022-10-18 LAB — C3 AND C4
C3 Complement: 119 mg/dL (ref 83–193)
C4 Complement: 31 mg/dL (ref 15–57)

## 2022-10-18 LAB — SEDIMENTATION RATE: Sed Rate: 9 mm/h (ref 0–20)

## 2022-10-18 LAB — ANTI-DNA ANTIBODY, DOUBLE-STRANDED: ds DNA Ab: 1 IU/mL

## 2022-10-18 NOTE — Progress Notes (Signed)
CMP WNL.   UA normal  ESR and complements WNL.

## 2022-10-19 NOTE — Progress Notes (Signed)
dsDNA is negative

## 2022-12-31 DIAGNOSIS — Z13220 Encounter for screening for lipoid disorders: Secondary | ICD-10-CM | POA: Diagnosis not present

## 2022-12-31 DIAGNOSIS — M329 Systemic lupus erythematosus, unspecified: Secondary | ICD-10-CM | POA: Diagnosis not present

## 2022-12-31 DIAGNOSIS — Z0000 Encounter for general adult medical examination without abnormal findings: Secondary | ICD-10-CM | POA: Diagnosis not present

## 2023-01-24 DIAGNOSIS — Z124 Encounter for screening for malignant neoplasm of cervix: Secondary | ICD-10-CM | POA: Diagnosis not present

## 2023-01-24 DIAGNOSIS — Z01419 Encounter for gynecological examination (general) (routine) without abnormal findings: Secondary | ICD-10-CM | POA: Diagnosis not present

## 2023-01-24 DIAGNOSIS — Z6841 Body Mass Index (BMI) 40.0 and over, adult: Secondary | ICD-10-CM | POA: Diagnosis not present

## 2023-02-14 DIAGNOSIS — Z1231 Encounter for screening mammogram for malignant neoplasm of breast: Secondary | ICD-10-CM | POA: Diagnosis not present

## 2023-02-18 ENCOUNTER — Other Ambulatory Visit: Payer: Self-pay | Admitting: Obstetrics and Gynecology

## 2023-02-18 DIAGNOSIS — R928 Other abnormal and inconclusive findings on diagnostic imaging of breast: Secondary | ICD-10-CM

## 2023-02-22 DIAGNOSIS — M329 Systemic lupus erythematosus, unspecified: Secondary | ICD-10-CM | POA: Diagnosis not present

## 2023-02-22 DIAGNOSIS — R21 Rash and other nonspecific skin eruption: Secondary | ICD-10-CM | POA: Diagnosis not present

## 2023-02-22 DIAGNOSIS — Z79899 Other long term (current) drug therapy: Secondary | ICD-10-CM | POA: Diagnosis not present

## 2023-03-06 NOTE — Progress Notes (Deleted)
Office Visit Note  Patient: Jasmine Espinoza             Date of Birth: 01/29/1986           MRN: [id number]             PCP: Lucky Cowboy, MD Referring: Lucky Cowboy, MD Visit Date: 03/19/2023 Occupation: @GUAROCC @  Subjective:  No chief complaint on file.   History of Present Illness: Jasmine Espinoza is a 37 y.o. female ***     Activities of Daily Living:  Patient reports morning stiffness for *** {minute/hour:19697}.   Patient {ACTIONS;DENIES/REPORTS:21021675::"Denies"} nocturnal pain.  Difficulty dressing/grooming: {ACTIONS;DENIES/REPORTS:21021675::"Denies"} Difficulty climbing stairs: {ACTIONS;DENIES/REPORTS:21021675::"Denies"} Difficulty getting out of chair: {ACTIONS;DENIES/REPORTS:21021675::"Denies"} Difficulty using hands for taps, buttons, cutlery, and/or writing: {ACTIONS;DENIES/REPORTS:21021675::"Denies"}  No Rheumatology ROS completed.   PMFS History:  Patient Active Problem List   Diagnosis Date Noted   Lupus ([facility]) 05/25/2020   Depression, major, recurrent, in partial remission ([facility]) 05/25/2020   Obesity 07/27/2014   Postpartum depression 04/08/2014    Past Medical History:  Diagnosis Date   Abnormal Pap smear    Hx of blood clots 2015   on uterus during pregnancy   Lupus ([facility])    Obesity (BMI 30-39.9)    BMI 38   Postpartum depression 04/08/2014    Family History  Problem Relation Age of Onset   Cancer Maternal Grandmother        breast, late 72s   Lupus Paternal Grandmother    Heart disease Paternal Grandfather        does not know him, died when she was14   Past Surgical History:  Procedure Laterality Date   WISDOM TOOTH EXTRACTION     Social History   Social History Narrative   Not on file   Immunization History  Administered Date(s) Administered   Influenza Split 08/01/2015   Influenza-Unspecified 07/09/2016   PPD Test 02/02/2015, 03/15/2015, 04/19/2016   Tdap 04/24/2013     Objective: Vital Signs: There were no  vitals taken for this visit.   Physical Exam   Musculoskeletal Exam: ***  CDAI Exam: CDAI Score: -- Patient Global: --; Provider Global: -- Swollen: --; Tender: -- Joint Exam 03/19/2023   No joint exam has been documented for this visit   There is currently no information documented on the homunculus. Go to the Rheumatology activity and complete the homunculus joint exam.  Investigation: No additional findings.  Imaging: No results found.  Recent Labs: Lab Results  Component Value Date   WBC 3.9 10/17/2022   HGB 13.8 10/17/2022   PLT 256 10/17/2022   NA 139 10/17/2022   K 4.4 10/17/2022   CL 104 10/17/2022   CO2 26 10/17/2022   GLUCOSE 81 10/17/2022   BUN 10 10/17/2022   CREATININE 0.72 10/17/2022   BILITOT 0.3 10/17/2022   ALKPHOS 91 08/01/2016   AST 12 10/17/2022   ALT 15 10/17/2022   PROT 7.5 10/17/2022   ALBUMIN 3.9 08/01/2016   CALCIUM 9.4 10/17/2022   GFRAA 135 01/11/2021   QFTBGOLDPLUS NEGATIVE 01/11/2020    Speciality Comments: PLQ Eye Exam: 10/21/2020 WNL @ [facility] [place] Follow up in 12 months  Procedures:  No procedures performed Allergies: Patient has no known allergies.   Assessment / Plan:     Visit Diagnoses: Other organ or system involvement in systemic lupus erythematosus ([facility])  High risk medication use  Raynaud's syndrome without gangrene  Hair loss  History of blood clots  Chronic pain of  both knees  Vitamin D deficiency  Seasonal allergies  History of gastroesophageal reflux (GERD)  Family history of systemic lupus erythematosus  Orders: No orders of the defined types were placed in this encounter.  No orders of the defined types were placed in this encounter.   Face-to-face time spent with patient was *** minutes. Greater than 50% of time was spent in counseling and coordination of care.  Follow-Up Instructions: No follow-ups on file.   Gearldine Bienenstock, PA-C  Note - This record has been created using Dragon  software.  Chart creation errors have been sought, but may not always  have been located. Such creation errors do not reflect on  the standard of medical care.

## 2023-03-08 ENCOUNTER — Ambulatory Visit
Admission: RE | Admit: 2023-03-08 | Discharge: 2023-03-08 | Disposition: A | Discharge status: Home / Self Care | Payer: BC Managed Care – PPO | Source: Ambulatory Visit | Attending: Obstetrics and Gynecology | Admitting: Obstetrics and Gynecology

## 2023-03-08 DIAGNOSIS — R590 Localized enlarged lymph nodes: Secondary | ICD-10-CM | POA: Diagnosis not present

## 2023-03-08 DIAGNOSIS — R922 Inconclusive mammogram: Secondary | ICD-10-CM | POA: Diagnosis not present

## 2023-03-08 DIAGNOSIS — R928 Other abnormal and inconclusive findings on diagnostic imaging of breast: Secondary | ICD-10-CM

## 2023-03-13 DIAGNOSIS — L578 Other skin changes due to chronic exposure to nonionizing radiation: Secondary | ICD-10-CM | POA: Diagnosis not present

## 2023-03-13 DIAGNOSIS — L719 Rosacea, unspecified: Secondary | ICD-10-CM | POA: Diagnosis not present

## 2023-03-13 DIAGNOSIS — R21 Rash and other nonspecific skin eruption: Secondary | ICD-10-CM | POA: Diagnosis not present

## 2023-03-13 DIAGNOSIS — L219 Seborrheic dermatitis, unspecified: Secondary | ICD-10-CM | POA: Diagnosis not present

## 2023-03-19 ENCOUNTER — Ambulatory Visit: Payer: BC Managed Care – PPO | Admitting: Physician Assistant

## 2023-03-19 DIAGNOSIS — Z79899 Other long term (current) drug therapy: Secondary | ICD-10-CM

## 2023-03-19 DIAGNOSIS — Z86718 Personal history of other venous thrombosis and embolism: Secondary | ICD-10-CM

## 2023-03-19 DIAGNOSIS — Z8269 Family history of other diseases of the musculoskeletal system and connective tissue: Secondary | ICD-10-CM

## 2023-03-19 DIAGNOSIS — J302 Other seasonal allergic rhinitis: Secondary | ICD-10-CM

## 2023-03-19 DIAGNOSIS — G8929 Other chronic pain: Secondary | ICD-10-CM

## 2023-03-19 DIAGNOSIS — Z8719 Personal history of other diseases of the digestive system: Secondary | ICD-10-CM

## 2023-03-19 DIAGNOSIS — E559 Vitamin D deficiency, unspecified: Secondary | ICD-10-CM

## 2023-03-19 DIAGNOSIS — M3219 Other organ or system involvement in systemic lupus erythematosus: Secondary | ICD-10-CM

## 2023-03-19 DIAGNOSIS — L659 Nonscarring hair loss, unspecified: Secondary | ICD-10-CM

## 2023-03-19 DIAGNOSIS — I7300 Raynaud's syndrome without gangrene: Secondary | ICD-10-CM

## 2023-05-04 DIAGNOSIS — R52 Pain, unspecified: Secondary | ICD-10-CM | POA: Diagnosis not present

## 2023-05-04 DIAGNOSIS — Z20818 Contact with and (suspected) exposure to other bacterial communicable diseases: Secondary | ICD-10-CM | POA: Diagnosis not present

## 2023-05-04 DIAGNOSIS — J029 Acute pharyngitis, unspecified: Secondary | ICD-10-CM | POA: Diagnosis not present

## 2023-05-04 DIAGNOSIS — J069 Acute upper respiratory infection, unspecified: Secondary | ICD-10-CM | POA: Diagnosis not present

## 2023-05-04 DIAGNOSIS — Z20822 Contact with and (suspected) exposure to covid-19: Secondary | ICD-10-CM | POA: Diagnosis not present

## 2023-05-09 NOTE — Progress Notes (Deleted)
COMPLETE PHYSICAL  Assessment and Plan:  Jasmine Espinoza was seen today for annual exam.  Diagnoses and all orders for this visit:  Encounter for routine adult medical exam with abnormal findings Yearly  Lupus (HCC) Continue follow up !61month with Rheumatology   Depression, major, recurrent, in partial remission ([facility]) Discussed dietary and exercise modifications  Abnormal glucose Continue to work on diet and exercise -     Hemoglobin A1c  Obesity Long discussion about weight loss, diet, and exercise Recommended diet heavy in fruits and veggies and low in animal meats, cheeses, and dairy products, appropriate calorie intake Patient will work on *** Discussed appropriate weight for height (below***) and initial goal (***) Follow up at next visit  Raynaud's disease without gangrene Managing well at this time  Anemia, unspecified type No iron supplementation - CBC  Screening cholesterol level -     Lipid panel  Screening for blood or protein in urine -     Routine UA with reflex microscopic  Screening for thyroid disorder -     TSH  Medication management -     CBC with Differential/Platelet -     COMPLETE METABOLIC PANEL WITH GFR - Magnesium  Vitamin D deficiency -     VITAMIN D 25 Hydroxy (Vit-D Deficiency, Fractures)      Further disposition pending results if labs check today. Discussed med's effects and SE's.   Over 30 minutes of face to face interview, exam, counseling, chart review, and critical decision making was performed.   Discussed med's effects and SE's. Screening labs and tests as requested with regular follow-up as recommended.   HPI  37 y.o. female  presents for a complete physical.  She has Lupus and follows with Dr. Corliss Skains every 3 months.  She is taking Plaquenil 100mg  once daily.  She follows with Dr Rana Snare for her womens health.  She is on mirena which is helping.  She is married two children, girls, 10 and 7.  Personal history of  COVID 05/2020.  Reports she was out of work for over 3 weeks.  She was extremley fatigued and also has some shortness of breath. She did have post covid pneumonia.  No hospitalization.  She would like antibody levels checked today as she is considering vaccination.   Her blood pressure has been controlled at home, today their BP is   She does not workout regularly but would like to start exercising more.   She denies chest pain, shortness of breath, dizziness.  She has raynauds, states has been worse with the winter/cold.  Hands and feet and would like to get on medication for me.  Patient is on Vitamin D supplement, she is taking 50,000 IU once a week.  Lab Results  Component Value Date   VD25OH 34 05/15/2022    BMI is There is no height or weight on file to calculate BMI. Wt Readings from Last 3 Encounters:  10/17/22 231 lb 12.8 oz (105.1 kg)  05/15/22 221 lb (100.2 kg)  12/05/21 210 lb 3.2 oz (95.3 kg)      Due to obesity, she has preDM, denies DM poly's.  Lab Results  Component Value Date   HGBA1C 5.2 01/11/2021   She has a 36 year old and a 37 year old, both girls, competitive dance, are in school daily at private school. She started new job, working from home, was working with her mom but they got into an argument 6-8 months ago.     Current Medications:  Current Outpatient Medications on File Prior to Visit  Medication Sig Dispense Refill   cetirizine (ZYRTEC) 5 MG tablet Take 5 mg by mouth daily.     fluticasone (FLONASE) 50 MCG/ACT nasal spray Place 2 sprays into both nostrils daily. (Patient not taking: Reported on 05/15/2022) 16 g 0   hydroxychloroquine (PLAQUENIL) 200 MG tablet Take 200mg  by mouth twice daily Monday through Friday only. None on Saturday or Sunday. 120 tablet 0   omeprazole (PRILOSEC) 40 MG capsule TAKE ONE CAPSULE BY MOUTH DAILY FOR INDIGESTION AND HEARTBURN 90 capsule 0   No current facility-administered medications on file prior to visit.   Health  Maintenance:   Immunization History  Administered Date(s) Administered   Influenza Split 08/01/2015   Influenza-Unspecified 07/09/2016   PPD Test 02/02/2015, 03/15/2015, 04/19/2016   Tdap 04/24/2013   Tetanus: 2014 Pneumovax: N/A Flu vaccine: did not get this year Zostavax: N/A  LMP: No LMP recorded. (Menstrual status: IUD).will have spotting Pap: 2012, follows Dr. Rana Snare MGM:N/A  DEXA: N/A Colonoscopy: N/A EGD: N/A  Patient Care Team: Lucky Cowboy, MD as PCP - General (Internal Medicine) Candice Camp, MD as Consulting Physician (Obstetrics and Gynecology)  Medical History:  Past Medical History:  Diagnosis Date   Abnormal Pap smear    Hx of blood clots 2015   on uterus during pregnancy   Lupus ([facility])    Obesity (BMI 30-39.9)    BMI 38   Postpartum depression 04/08/2014   Allergies No Known Allergies  SURGICAL HISTORY She  has a past surgical history that includes Wisdom tooth extraction. FAMILY HISTORY Her family history includes Cancer in her maternal grandmother; Heart disease in her paternal grandfather; Lupus in her paternal grandmother. SOCIAL HISTORY She  reports that she has never smoked. She has never been exposed to tobacco smoke. She has never used smokeless tobacco. She reports that she does not drink alcohol and does not use drugs.   Review of Systems  Constitutional:  Negative for chills, diaphoresis, fever, malaise/fatigue and weight loss.  HENT:  Negative for congestion, ear discharge, ear pain, hearing loss, nosebleeds and tinnitus.   Eyes:  Negative for blurred vision, double vision, photophobia and pain.  Respiratory:  Negative for cough, hemoptysis, sputum production, shortness of breath and wheezing.   Cardiovascular:  Negative for chest pain, palpitations, orthopnea, claudication, leg swelling and PND.  Gastrointestinal:  Positive for heartburn. Negative for abdominal pain, blood in stool, constipation, diarrhea, melena, nausea and vomiting.   Genitourinary:  Negative for dysuria, flank pain, frequency, hematuria and urgency.  Musculoskeletal:  Negative for back pain, falls, joint pain, myalgias and neck pain.  Skin:  Negative for itching and rash.  Neurological:  Negative for dizziness, tingling, tremors, sensory change, speech change, focal weakness, weakness and headaches.  Endo/Heme/Allergies:  Negative for environmental allergies and polydipsia. Does not bruise/bleed easily.  Psychiatric/Behavioral: Negative.  Negative for depression, hallucinations, substance abuse and suicidal ideas. The patient is not nervous/anxious and does not have insomnia.     Physical Exam: Estimated body mass index is 41.06 kg/m as calculated from the following:   Height as of 10/17/22: 5\' 3"  (1.6 m).   Weight as of 10/17/22: 231 lb 12.8 oz (105.1 kg). There were no vitals taken for this visit.   General Appearance: Well nourished, in no apparent distress.  Eyes: PERRLA, EOMs, conjunctiva no swelling or erythema, normal fundi and vessels.  Sinuses: No Frontal/maxillary tenderness  ENT/Mouth: Ext aud canals clear, normal light reflex with TMs without erythema,  bulging. Good dentition. No erythema, swelling, or exudate on post pharynx. Tonsils not swollen or erythematous. Hearing normal.  Neck: Supple, thyroid fullness, left posterior lymph node enlargement. No bruits  Respiratory: Respiratory effort normal, BS equal bilaterally without rales, rhonchi, wheezing or stridor.  Cardio: RRR without murmurs, rubs or gallops. Brisk peripheral pulses without edema.  Chest: symmetric, with normal excursions and percussion.  Breasts:defer Abdomen: Soft, nontender, obese, no guarding, rebound, hernias, masses, or organomegaly. . Lymphatics: no enlargement, nontender Genitourinary: defer Musculoskeletal: Full ROM all peripheral extremities,5/5 strength, and normal gait.  Skin: seb dermatitis on hair/forehead, dry flaky skin. Warm, dry without rashes, lesions,  ecchymosis. Neuro: Cranial nerves intact, reflexes equal bilaterally. Normal muscle tone, no cerebellar symptoms. Sensation intact.  Psych: Awake and oriented X 3, normal affect, Insight and Judgment appropriate.   EKG: defer   [facility]  05/09/2023

## 2023-05-10 ENCOUNTER — Encounter: Payer: Self-pay | Admitting: Nurse Practitioner

## 2023-05-10 DIAGNOSIS — Z1329 Encounter for screening for other suspected endocrine disorder: Secondary | ICD-10-CM

## 2023-05-10 DIAGNOSIS — E669 Obesity, unspecified: Secondary | ICD-10-CM

## 2023-05-10 DIAGNOSIS — I7300 Raynaud's syndrome without gangrene: Secondary | ICD-10-CM

## 2023-05-10 DIAGNOSIS — M329 Systemic lupus erythematosus, unspecified: Secondary | ICD-10-CM

## 2023-05-10 DIAGNOSIS — Z79899 Other long term (current) drug therapy: Secondary | ICD-10-CM

## 2023-05-10 DIAGNOSIS — D649 Anemia, unspecified: Secondary | ICD-10-CM

## 2023-05-10 DIAGNOSIS — F3341 Major depressive disorder, recurrent, in partial remission: Secondary | ICD-10-CM

## 2023-05-10 DIAGNOSIS — Z1389 Encounter for screening for other disorder: Secondary | ICD-10-CM

## 2023-05-10 DIAGNOSIS — E559 Vitamin D deficiency, unspecified: Secondary | ICD-10-CM

## 2023-05-10 DIAGNOSIS — Z13220 Encounter for screening for lipoid disorders: Secondary | ICD-10-CM

## 2023-05-10 DIAGNOSIS — R7309 Other abnormal glucose: Secondary | ICD-10-CM

## 2023-05-10 DIAGNOSIS — Z0001 Encounter for general adult medical examination with abnormal findings: Secondary | ICD-10-CM

## 2023-05-10 DIAGNOSIS — Z0000 Encounter for general adult medical examination without abnormal findings: Secondary | ICD-10-CM

## 2023-05-24 DIAGNOSIS — R252 Cramp and spasm: Secondary | ICD-10-CM | POA: Diagnosis not present

## 2023-05-24 DIAGNOSIS — Z6838 Body mass index (BMI) 38.0-38.9, adult: Secondary | ICD-10-CM | POA: Diagnosis not present

## 2023-08-30 DIAGNOSIS — R21 Rash and other nonspecific skin eruption: Secondary | ICD-10-CM | POA: Diagnosis not present

## 2023-08-30 DIAGNOSIS — Z79899 Other long term (current) drug therapy: Secondary | ICD-10-CM | POA: Diagnosis not present

## 2023-08-30 DIAGNOSIS — M329 Systemic lupus erythematosus, unspecified: Secondary | ICD-10-CM | POA: Diagnosis not present

## 2024-05-11 ENCOUNTER — Encounter: Payer: Self-pay | Admitting: Nurse Practitioner
